# Patient Record
Sex: Male | Born: 1970 | Race: White | Hispanic: No | Marital: Married | Smoking: Never smoker
Health system: Southern US, Community
[De-identification: ages and names within clinical notes are randomized; demographics above are authoritative.]

## PROBLEM LIST (undated history)

## (undated) DIAGNOSIS — G4700 Insomnia, unspecified: Secondary | ICD-10-CM

## (undated) DIAGNOSIS — R61 Generalized hyperhidrosis: Secondary | ICD-10-CM

## (undated) DIAGNOSIS — K602 Anal fissure, unspecified: Secondary | ICD-10-CM

## (undated) DIAGNOSIS — F419 Anxiety disorder, unspecified: Secondary | ICD-10-CM

## (undated) DIAGNOSIS — M545 Low back pain, unspecified: Secondary | ICD-10-CM

## (undated) HISTORY — DX: Insomnia, unspecified: G47.00

## (undated) HISTORY — DX: Low back pain, unspecified: M54.50

## (undated) HISTORY — DX: Anxiety disorder, unspecified: F41.9

## (undated) HISTORY — DX: Generalized hyperhidrosis: R61

## (undated) HISTORY — DX: Low back pain: M54.5

## (undated) HISTORY — DX: Anal fissure, unspecified: K60.2

---

## 2004-08-09 ENCOUNTER — Ambulatory Visit: Payer: Self-pay | Admitting: Family Medicine

## 2004-08-09 ENCOUNTER — Encounter: Admission: RE | Admit: 2004-08-09 | Discharge: 2004-08-09 | Payer: Self-pay | Admitting: Family Medicine

## 2005-09-17 ENCOUNTER — Ambulatory Visit: Payer: Self-pay | Admitting: Family Medicine

## 2005-09-21 ENCOUNTER — Ambulatory Visit: Payer: Self-pay | Admitting: Family Medicine

## 2005-11-27 IMAGING — CR DG CHEST 2V
2 series · 2 of 2 positions shown · non-contrast
Comparison: none

CLINICAL DATA: Cough and pharyngitis.
 TWO VIEW CHEST ? 08/09/04:
 No comparison.  
 Normal sized heart.  Minimal central peribronchial thickening.  No air space consolidation.  Normal appearing bones.

[view not recorded (1 of 2)]
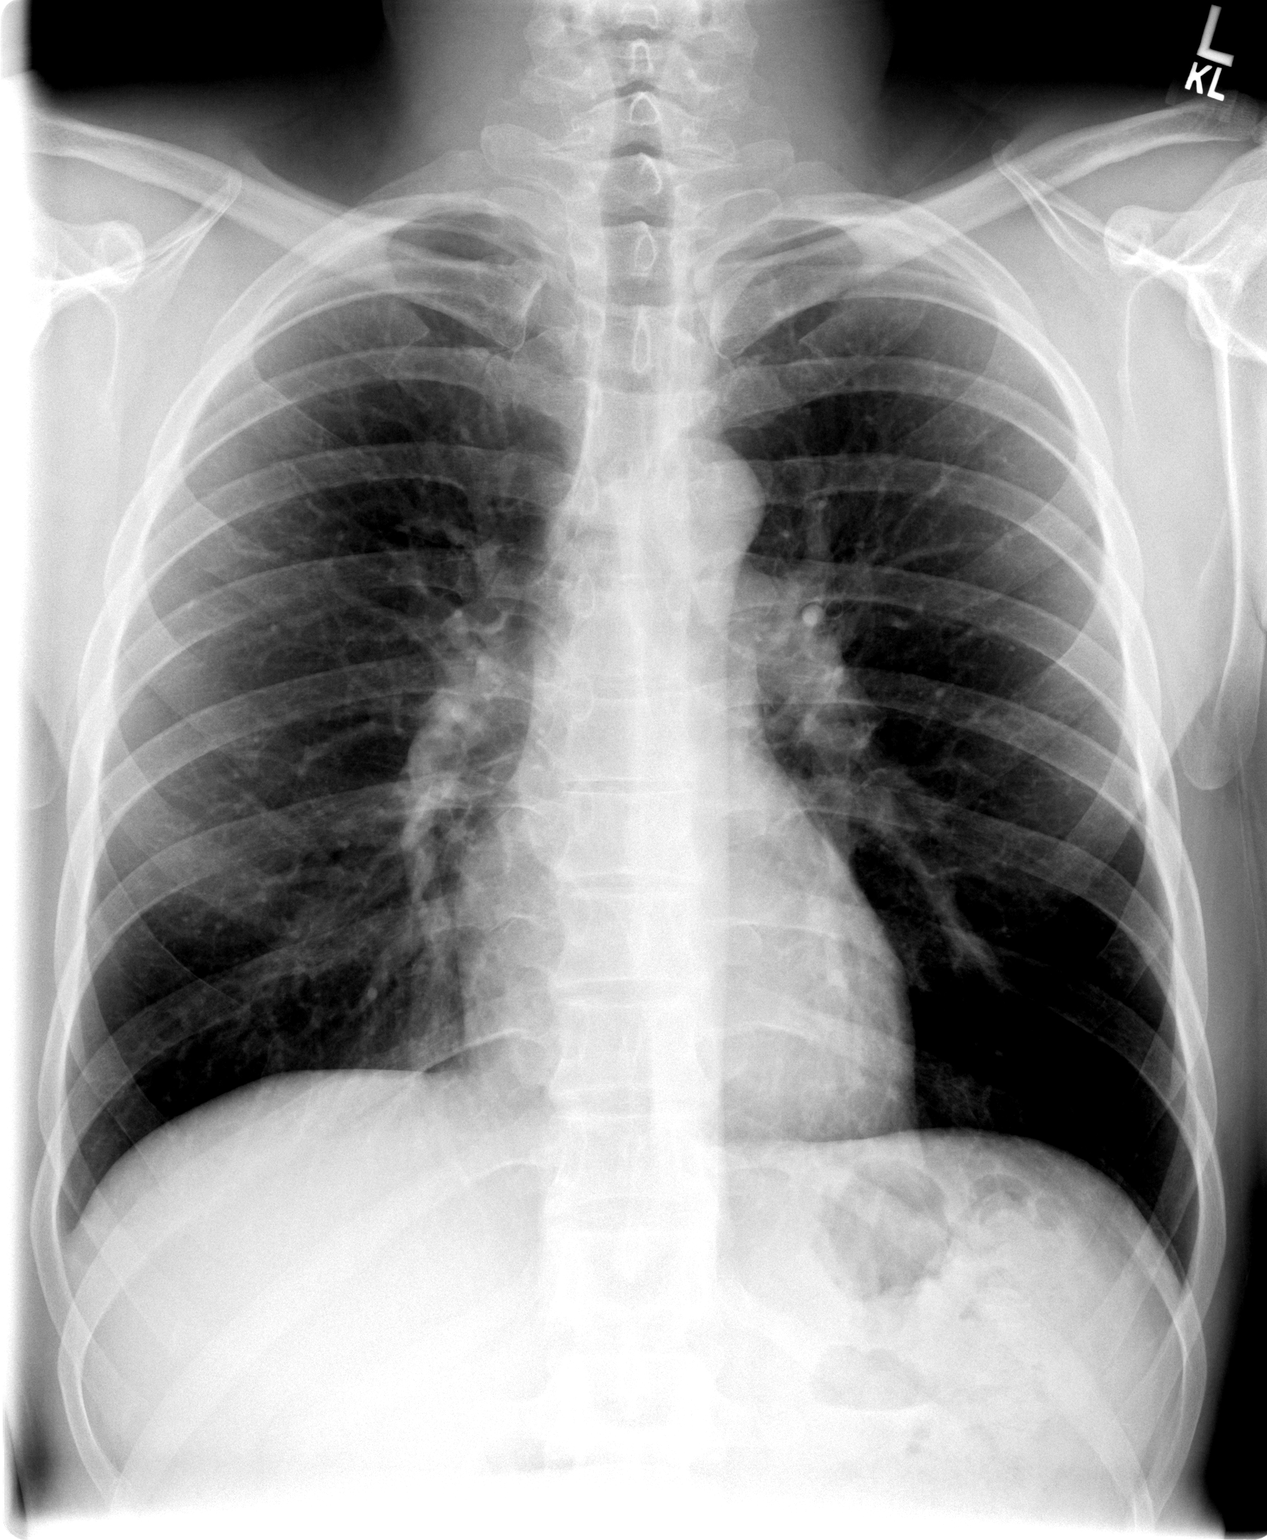

[view not recorded (2 of 2)]
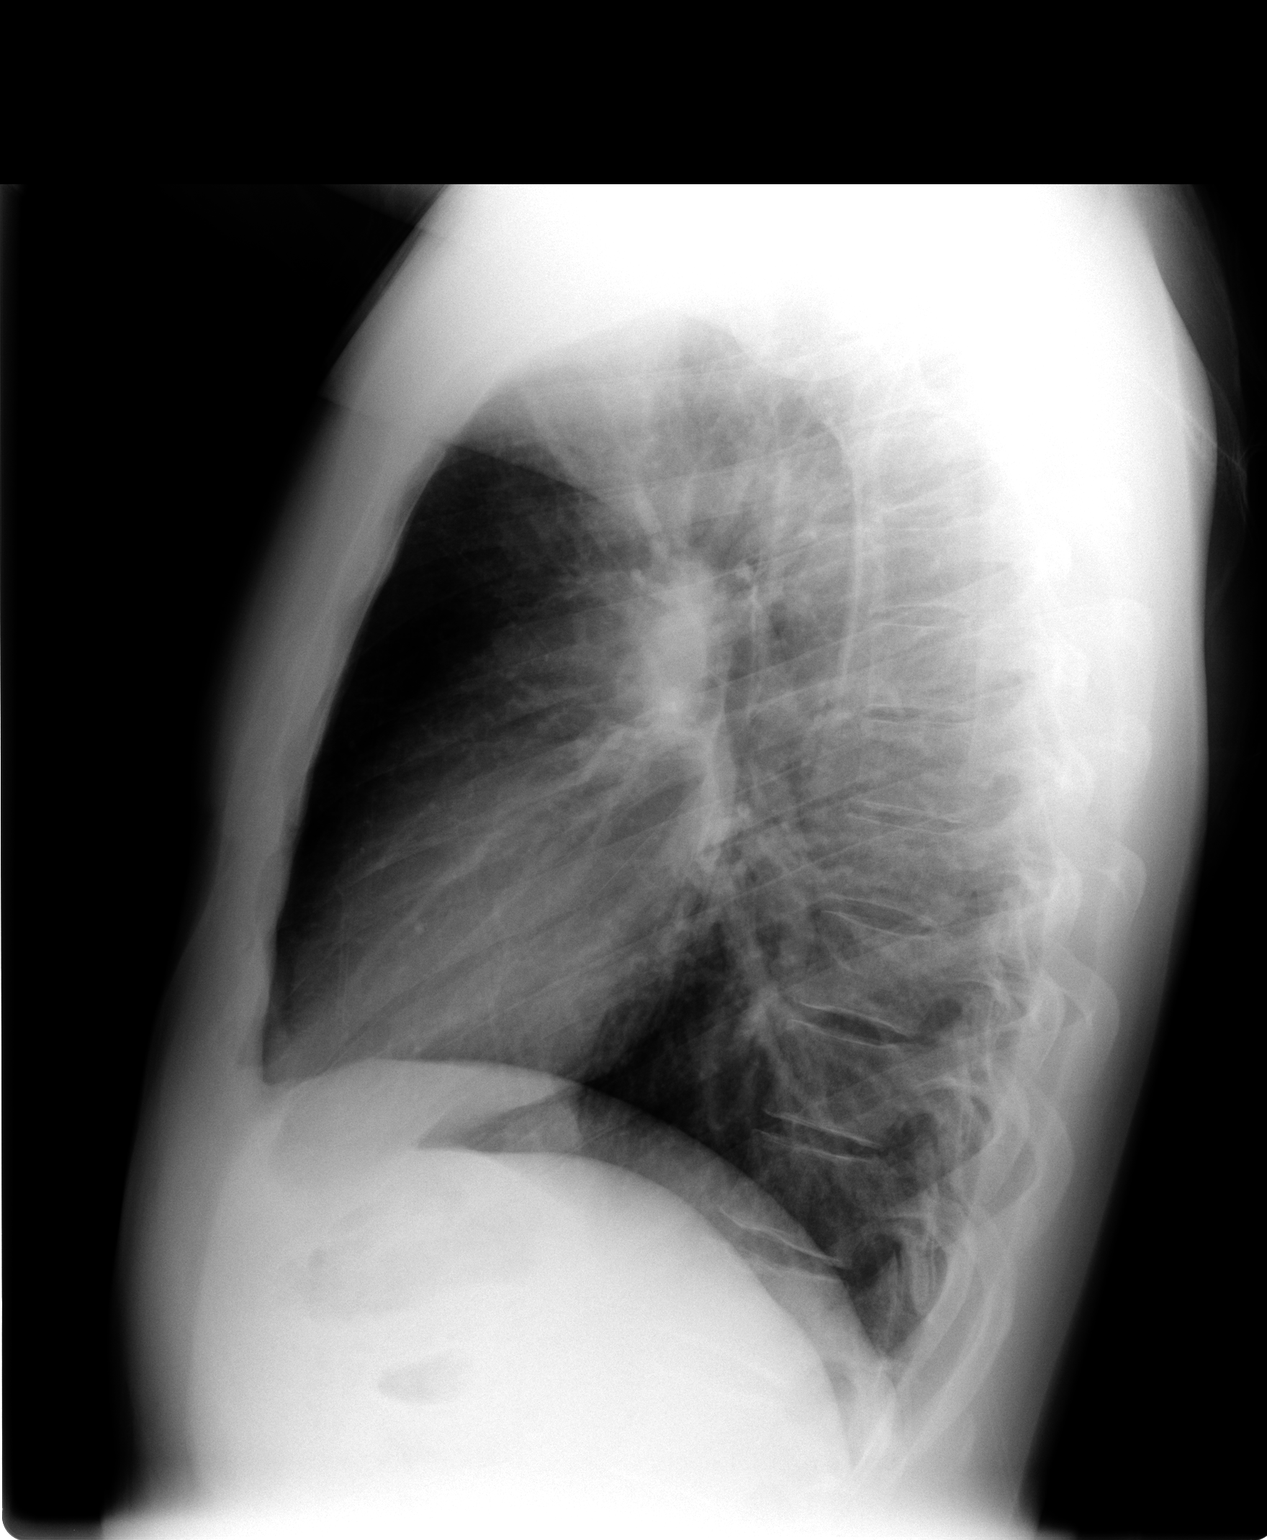

[2 of 2 positions shown; findings below may reference images not displayed]

IMPRESSION: Minimal bronchitic changes.

## 2006-01-18 ENCOUNTER — Ambulatory Visit: Payer: Self-pay | Admitting: Internal Medicine

## 2006-04-01 ENCOUNTER — Ambulatory Visit: Payer: Self-pay | Admitting: Family Medicine

## 2006-09-20 ENCOUNTER — Ambulatory Visit: Payer: Self-pay | Admitting: Internal Medicine

## 2007-03-19 ENCOUNTER — Ambulatory Visit: Payer: Self-pay | Admitting: Family Medicine

## 2007-03-19 DIAGNOSIS — J019 Acute sinusitis, unspecified: Secondary | ICD-10-CM

## 2007-03-19 DIAGNOSIS — Z9189 Other specified personal risk factors, not elsewhere classified: Secondary | ICD-10-CM

## 2007-04-29 ENCOUNTER — Ambulatory Visit: Payer: Self-pay | Admitting: Family Medicine

## 2007-04-29 DIAGNOSIS — R519 Headache, unspecified: Secondary | ICD-10-CM | POA: Insufficient documentation

## 2007-04-29 DIAGNOSIS — R51 Headache: Secondary | ICD-10-CM

## 2007-04-30 LAB — CONVERTED CEMR LAB
ALT: 46 units/L (ref 0–53)
AST: 87 units/L — ABNORMAL HIGH (ref 0–37)
Albumin: 4.1 g/dL (ref 3.5–5.2)
Alkaline Phosphatase: 47 units/L (ref 39–117)
BUN: 17 mg/dL (ref 6–23)
Basophils Absolute: 0 10*3/uL (ref 0.0–0.1)
Basophils Relative: 0.2 % (ref 0.0–1.0)
Bilirubin, Direct: 0.1 mg/dL (ref 0.0–0.3)
CO2: 26 meq/L (ref 19–32)
Calcium: 9.4 mg/dL (ref 8.4–10.5)
Chloride: 105 meq/L (ref 96–112)
Creatinine, Ser: 1 mg/dL (ref 0.4–1.5)
Eosinophils Absolute: 0.3 10*3/uL (ref 0.0–0.6)
Eosinophils Relative: 4.1 % (ref 0.0–5.0)
GFR calc Af Amer: 108 mL/min
GFR calc non Af Amer: 89 mL/min
Glucose, Bld: 92 mg/dL (ref 70–99)
HCT: 43.2 % (ref 39.0–52.0)
Hemoglobin: 14.8 g/dL (ref 13.0–17.0)
Lymphocytes Relative: 26.6 % (ref 12.0–46.0)
MCHC: 34.1 g/dL (ref 30.0–36.0)
MCV: 91.4 fL (ref 78.0–100.0)
Monocytes Absolute: 0.8 10*3/uL — ABNORMAL HIGH (ref 0.2–0.7)
Monocytes Relative: 11.3 % — ABNORMAL HIGH (ref 3.0–11.0)
Neutro Abs: 3.9 10*3/uL (ref 1.4–7.7)
Neutrophils Relative %: 57.8 % (ref 43.0–77.0)
Platelets: 215 10*3/uL (ref 150–400)
Potassium: 4.3 meq/L (ref 3.5–5.1)
RBC: 4.73 M/uL (ref 4.22–5.81)
RDW: 12.7 % (ref 11.5–14.6)
Sodium: 139 meq/L (ref 135–145)
TSH: 1.73 microintl units/mL (ref 0.35–5.50)
Total Bilirubin: 0.6 mg/dL (ref 0.3–1.2)
Total Protein: 6.8 g/dL (ref 6.0–8.3)
WBC: 6.8 10*3/uL (ref 4.5–10.5)

## 2007-05-01 ENCOUNTER — Telehealth: Payer: Self-pay | Admitting: Family Medicine

## 2007-05-01 ENCOUNTER — Ambulatory Visit: Payer: Self-pay | Admitting: Cardiology

## 2007-07-17 DIAGNOSIS — K602 Anal fissure, unspecified: Secondary | ICD-10-CM

## 2007-08-18 ENCOUNTER — Ambulatory Visit: Payer: Self-pay | Admitting: Family Medicine

## 2007-09-25 ENCOUNTER — Ambulatory Visit: Payer: Self-pay | Admitting: Family Medicine

## 2007-09-25 DIAGNOSIS — M546 Pain in thoracic spine: Secondary | ICD-10-CM

## 2007-10-03 ENCOUNTER — Encounter: Admission: RE | Admit: 2007-10-03 | Discharge: 2007-10-03 | Payer: Self-pay | Admitting: Family Medicine

## 2007-10-13 ENCOUNTER — Ambulatory Visit: Payer: Self-pay | Admitting: Family Medicine

## 2007-10-13 ENCOUNTER — Encounter: Payer: Self-pay | Admitting: Family Medicine

## 2007-10-13 ENCOUNTER — Encounter: Admission: RE | Admit: 2007-10-13 | Discharge: 2007-11-12 | Payer: Self-pay | Admitting: Family Medicine

## 2007-11-03 ENCOUNTER — Ambulatory Visit: Payer: Self-pay | Admitting: Licensed Clinical Social Worker

## 2007-11-03 ENCOUNTER — Ambulatory Visit: Payer: Self-pay | Admitting: Family Medicine

## 2007-11-03 DIAGNOSIS — L290 Pruritus ani: Secondary | ICD-10-CM

## 2007-11-03 DIAGNOSIS — R61 Generalized hyperhidrosis: Secondary | ICD-10-CM

## 2007-11-10 ENCOUNTER — Ambulatory Visit: Payer: Self-pay | Admitting: Licensed Clinical Social Worker

## 2007-11-18 ENCOUNTER — Ambulatory Visit: Payer: Self-pay | Admitting: Family Medicine

## 2007-11-18 DIAGNOSIS — R599 Enlarged lymph nodes, unspecified: Secondary | ICD-10-CM

## 2007-11-24 ENCOUNTER — Ambulatory Visit: Payer: Self-pay | Admitting: Licensed Clinical Social Worker

## 2007-12-04 ENCOUNTER — Ambulatory Visit: Payer: Self-pay | Admitting: Family Medicine

## 2007-12-23 ENCOUNTER — Telehealth: Payer: Self-pay | Admitting: Family Medicine

## 2008-02-09 ENCOUNTER — Ambulatory Visit: Payer: Self-pay | Admitting: Family Medicine

## 2008-02-09 LAB — CONVERTED CEMR LAB
Bilirubin Urine: NEGATIVE
Blood in Urine, dipstick: NEGATIVE
Glucose, Urine, Semiquant: NEGATIVE
Ketones, urine, test strip: NEGATIVE
Nitrite: NEGATIVE
Protein, U semiquant: NEGATIVE
Specific Gravity, Urine: 1.01
Urobilinogen, UA: 0.2
WBC Urine, dipstick: NEGATIVE
pH: 6

## 2008-02-10 LAB — CONVERTED CEMR LAB
ALT: 30 units/L (ref 0–53)
AST: 35 units/L (ref 0–37)
Albumin: 4.1 g/dL (ref 3.5–5.2)
Alkaline Phosphatase: 41 units/L (ref 39–117)
BUN: 16 mg/dL (ref 6–23)
Basophils Absolute: 0.1 10*3/uL (ref 0.0–0.1)
Basophils Relative: 1.1 % — ABNORMAL HIGH (ref 0.0–1.0)
Bilirubin, Direct: 0.1 mg/dL (ref 0.0–0.3)
CO2: 30 meq/L (ref 19–32)
Calcium: 9.3 mg/dL (ref 8.4–10.5)
Chloride: 102 meq/L (ref 96–112)
Cholesterol: 198 mg/dL (ref 0–200)
Creatinine, Ser: 0.9 mg/dL (ref 0.4–1.5)
Eosinophils Absolute: 0.4 10*3/uL (ref 0.0–0.7)
Eosinophils Relative: 7.6 % — ABNORMAL HIGH (ref 0.0–5.0)
GFR calc Af Amer: 121 mL/min
GFR calc non Af Amer: 100 mL/min
Glucose, Bld: 93 mg/dL (ref 70–99)
HCT: 44.4 % (ref 39.0–52.0)
HDL: 89.1 mg/dL (ref >39.0)
Hemoglobin: 15.4 g/dL (ref 13.0–17.0)
LDL Cholesterol: 92 mg/dL (ref 0–99)
Lymphocytes Relative: 33.6 % (ref 12.0–46.0)
MCHC: 34.6 g/dL (ref 30.0–36.0)
MCV: 89.7 fL (ref 78.0–100.0)
Monocytes Absolute: 0.4 10*3/uL (ref 0.1–1.0)
Monocytes Relative: 8.4 % (ref 3.0–12.0)
Neutro Abs: 2.5 10*3/uL (ref 1.4–7.7)
Neutrophils Relative %: 49.3 % (ref 43.0–77.0)
Platelets: 182 10*3/uL (ref 150–400)
Potassium: 4.3 meq/L (ref 3.5–5.1)
RBC: 4.95 M/uL (ref 4.22–5.81)
RDW: 13.1 % (ref 11.5–14.6)
Sodium: 139 meq/L (ref 135–145)
TSH: 1.53 microintl units/mL (ref 0.35–5.50)
Total Bilirubin: 1.1 mg/dL (ref 0.3–1.2)
Total CHOL/HDL Ratio: 2.2
Total Protein: 7.1 g/dL (ref 6.0–8.3)
Triglycerides: 83 mg/dL (ref 0–149)
VLDL: 17 mg/dL (ref 0–40)
WBC: 5.1 10*3/uL (ref 4.5–10.5)

## 2008-02-19 ENCOUNTER — Ambulatory Visit: Payer: Self-pay | Admitting: Family Medicine

## 2008-02-19 DIAGNOSIS — F411 Generalized anxiety disorder: Secondary | ICD-10-CM | POA: Insufficient documentation

## 2008-05-20 ENCOUNTER — Ambulatory Visit: Payer: Self-pay | Admitting: Family Medicine

## 2008-06-23 ENCOUNTER — Ambulatory Visit: Payer: Self-pay | Admitting: Family Medicine

## 2008-06-23 DIAGNOSIS — M545 Low back pain: Secondary | ICD-10-CM

## 2008-06-23 DIAGNOSIS — F329 Major depressive disorder, single episode, unspecified: Secondary | ICD-10-CM | POA: Insufficient documentation

## 2008-09-20 ENCOUNTER — Ambulatory Visit: Payer: Self-pay | Admitting: Family Medicine

## 2008-11-17 ENCOUNTER — Telehealth: Payer: Self-pay | Admitting: Family Medicine

## 2009-03-08 ENCOUNTER — Ambulatory Visit: Payer: Self-pay | Admitting: Licensed Clinical Social Worker

## 2009-03-25 ENCOUNTER — Ambulatory Visit: Payer: Self-pay | Admitting: Family Medicine

## 2009-04-04 ENCOUNTER — Telehealth: Payer: Self-pay | Admitting: Family Medicine

## 2009-05-09 ENCOUNTER — Ambulatory Visit: Payer: Self-pay | Admitting: Gastroenterology

## 2009-05-09 DIAGNOSIS — K625 Hemorrhage of anus and rectum: Secondary | ICD-10-CM

## 2009-05-10 ENCOUNTER — Ambulatory Visit: Payer: Self-pay | Admitting: Gastroenterology

## 2009-05-10 HISTORY — PX: OTHER SURGICAL HISTORY: SHX169

## 2009-05-10 LAB — HM COLONOSCOPY

## 2009-07-21 ENCOUNTER — Ambulatory Visit: Payer: Self-pay | Admitting: Family Medicine

## 2009-11-09 ENCOUNTER — Telehealth: Payer: Self-pay | Admitting: Family Medicine

## 2009-11-11 ENCOUNTER — Ambulatory Visit: Payer: Self-pay | Admitting: Family Medicine

## 2009-11-11 DIAGNOSIS — R7402 Elevation of levels of lactic acid dehydrogenase (LDH): Secondary | ICD-10-CM | POA: Insufficient documentation

## 2009-11-11 DIAGNOSIS — R740 Nonspecific elevation of levels of transaminase and lactic acid dehydrogenase [LDH]: Secondary | ICD-10-CM

## 2009-11-14 LAB — CONVERTED CEMR LAB
ALT: 56 units/L — ABNORMAL HIGH (ref 0–53)
AST: 91 units/L — ABNORMAL HIGH (ref 0–37)
Albumin: 4.3 g/dL (ref 3.5–5.2)
Alkaline Phosphatase: 37 units/L — ABNORMAL LOW (ref 39–117)
Ferritin: 42.2 ng/mL (ref 22.0–322.0)
GGT: 25 units/L (ref 7–51)
Iron: 63 ug/dL (ref 42–165)
Total Bilirubin: 0.7 mg/dL (ref 0.3–1.2)

## 2009-11-24 ENCOUNTER — Ambulatory Visit: Payer: Self-pay | Admitting: Family Medicine

## 2009-11-24 DIAGNOSIS — J209 Acute bronchitis, unspecified: Secondary | ICD-10-CM

## 2009-11-24 DIAGNOSIS — J0100 Acute maxillary sinusitis, unspecified: Secondary | ICD-10-CM

## 2009-12-16 ENCOUNTER — Ambulatory Visit: Payer: Self-pay | Admitting: Family Medicine

## 2009-12-16 ENCOUNTER — Telehealth: Payer: Self-pay | Admitting: Family Medicine

## 2009-12-16 DIAGNOSIS — S6390XA Sprain of unspecified part of unspecified wrist and hand, initial encounter: Secondary | ICD-10-CM | POA: Insufficient documentation

## 2009-12-19 LAB — CONVERTED CEMR LAB
ALT: 26 units/L (ref 0–53)
AST: 26 units/L (ref 0–37)
Albumin: 4.1 g/dL (ref 3.5–5.2)
Alkaline Phosphatase: 41 units/L (ref 39–117)
Bilirubin, Direct: 0.1 mg/dL (ref 0.0–0.3)
Total Bilirubin: 0.6 mg/dL (ref 0.3–1.2)
Total Protein: 6.9 g/dL (ref 6.0–8.3)

## 2009-12-20 ENCOUNTER — Telehealth: Payer: Self-pay | Admitting: Family Medicine

## 2010-01-04 ENCOUNTER — Encounter: Payer: Self-pay | Admitting: Family Medicine

## 2010-01-04 LAB — CONVERTED CEMR LAB
HCV Ab: NEGATIVE
Hep B Core Total Ab: NEGATIVE
Hep B S Ab: NEGATIVE
Hepatitis B Surface Ag: NEGATIVE

## 2010-04-21 ENCOUNTER — Ambulatory Visit: Payer: Self-pay | Admitting: Family Medicine

## 2010-04-21 DIAGNOSIS — G4761 Periodic limb movement disorder: Secondary | ICD-10-CM

## 2010-05-15 ENCOUNTER — Encounter: Payer: Self-pay | Admitting: Family Medicine

## 2010-06-13 ENCOUNTER — Encounter: Payer: Self-pay | Admitting: Family Medicine

## 2010-06-26 ENCOUNTER — Encounter: Payer: Self-pay | Admitting: Family Medicine

## 2010-06-27 ENCOUNTER — Encounter: Payer: Self-pay | Admitting: Family Medicine

## 2010-07-27 ENCOUNTER — Encounter: Payer: Self-pay | Admitting: Family Medicine

## 2010-09-19 NOTE — Assessment & Plan Note (Signed)
Summary: ?broken finger/over 1 wk ago/cjr   Vital Signs:  Patient profile:   40 year old male Weight:      182 pounds BMI:     26.97 BP sitting:   110 / 78  (left arm) Cuff size:   regular  Vitals Entered By: Raechel Ache, RN (December 16, 2009 10:41 AM) CC: C/o sore and swollen R middle finger- injured playing softball 8 days ago.   History of Present Illness: Here to check his right 3rd finger which was injured 8 days ago. While trying to catch a baseball, the finger was struck end on. he has been wearing a splint since then. The distal joint is still very tender and swollen however. he is right handed. Also he would like to check his liver enzymes again today.   Allergies: No Known Drug Allergies  Past History:  Past Medical History: Reviewed history from 05/09/2009 and no changes required. insomnia Anxiety hyperhydrosis Low back pain Anal Fissure  Past Surgical History: Reviewed history from 03/19/2007 and no changes required. Denies surgical history  Review of Systems  The patient denies anorexia, fever, weight loss, weight gain, vision loss, decreased hearing, hoarseness, chest pain, syncope, dyspnea on exertion, peripheral edema, prolonged cough, headaches, hemoptysis, abdominal pain, melena, hematochezia, severe indigestion/heartburn, hematuria, incontinence, genital sores, muscle weakness, suspicious skin lesions, transient blindness, difficulty walking, depression, unusual weight change, abnormal bleeding, enlarged lymph nodes, angioedema, breast masses, and testicular masses.    Physical Exam  General:  Well-developed,well-nourished,in no acute distress; alert,appropriate and cooperative throughout examination Extremities:  the right 3rd finger is diffusely swollen, especially the DIP joint. This is tender, and he has very reduced ROM. Allignment is good.   Impression & Recommendations:  Problem # 1:  FINGER SPRAIN (ICD-842.10)  Problem # 2:  TRANSAMINASES,  SERUM, ELEVATED (ICD-790.4)  Orders: Venipuncture (57322) TLB-Hepatic/Liver Function Pnl (80076-HEPATIC)  Complete Medication List: 1)  Analpram-hc 1-2.5 % Crea (Hydrocortisone ace-pramoxine) .... Apply two times a day as needed 2)  Drysol 20 % Soln (Aluminum chloride) .... Apply once daily as needed 3)  Lorazepam 1 Mg Tabs (Lorazepam) .Marland Kitchen.. 1 by mouth every 6 hours as needed for anxiety 4)  Anusol-hc 25 Mg Supp (Hydrocortisone acetate) .... Take one suppository pr q.h.s.  Patient Instructions: 1)  Check liver enzymes today. I am concerned about damage to the joint capsule of this finger, so we will get him in to see Orthopedics today.

## 2010-09-19 NOTE — Op Note (Signed)
Summary: Thoracic Epidural Steroid Injection/[facility]  Thoracic Epidural Steroid Injection/[facility]   Imported By: Maryln Gottron 06/19/2010 12:54:39  _____________________________________________________________________  External Attachment:    Type:   Image     Comment:   External Document

## 2010-09-19 NOTE — Progress Notes (Signed)
Summary: Pt req to get labs ordered to test liver function  Phone Note Call from Patient Call back at Work Phone [phone]   Caller: Patient Reason for Call: Refill Medication Summary of Call: Pt called and is req to be labs order for liver function. Pt had testing done via insurance and liver enzymes were extremly elevated.  Initial call taken by: Lucy Antigua,  November 09, 2009 2:27 PM  Follow-up for Phone Call        pt called back and has ov we dr Shreya Lacasse on Friday am, but is concerned becuase his life insurance was denied due to elevated lfts.  wants to know if dr Rayola Everhart h as any idea why? He will discuss at ov on friday but is very concerned -wonders if he has any ideas? Follow-up by: Willy Eddy, LPN,  November 09, 2009 2:42 PM  Additional Follow-up for Phone Call Additional follow up Details #1::        No I would need to see him to talk about this Additional Follow-up by: Nelwyn Salisbury MD,  November 09, 2009 5:14 PM    Additional Follow-up for Phone Call Additional follow up Details #2::    called. Follow-up by: Raechel Ache, RN,  November 09, 2009 5:21 PM

## 2010-09-19 NOTE — Letter (Signed)
Summary: [facility]  [facility]   Imported By: Maryln Gottron 12/22/2009 13:29:34  _____________________________________________________________________  External Attachment:    Type:   Image     Comment:   External Document

## 2010-09-19 NOTE — Assessment & Plan Note (Signed)
Summary: ?sinus inf/cough/cjr   Vital Signs:  Patient profile:   40 year old male Weight:      182 pounds O2 Sat:      98 % Temp:     98 degrees F Pulse rate:   72 / minute BP sitting:   120 / 78  (left arm)  Vitals Entered By: Pura Spice, RN (November 24, 2009 9:12 AM) CC: cough yellowish sputum stuffy runny nose taking OTC Claritin not helping Is Patient Diabetic? No   History of Present Illness: This 40 year old white male is in today complaining of call productive of yellowish-green sputum, runny stuffy nose and mild sore throat is initiated about 9 days ago and he was taking Claritin but his symptoms were increasing in severity He related a history of having a six-year-old daughter who is in remission from acute lymphocytic leukemia which had its onset at age 68, treated [facility] and is doing well  Allergies (verified): No Known Drug Allergies  Past History:  Past Medical History: Last updated: 05/09/2009 insomnia Anxiety hyperhydrosis Low back pain Anal Fissure  Past Surgical History: Last updated: 03/19/2007 Denies surgical history  Social History: Last updated: 05/09/2009 Married Never Smoked Occupation: Airline pilot Alcohol use-yes Daily Caffeine Use 3-4 Illicit Drug Use - no  Risk Factors: Smoking Status: never (04/29/2007)  Review of Systems  The patient denies anorexia, fever, weight loss, weight gain, vision loss, decreased hearing, hoarseness, chest pain, syncope, dyspnea on exertion, peripheral edema, prolonged cough, headaches, hemoptysis, abdominal pain, melena, hematochezia, severe indigestion/heartburn, hematuria, incontinence, genital sores, muscle weakness, suspicious skin lesions, transient blindness, difficulty walking, depression, unusual weight change, abnormal bleeding, enlarged lymph nodes, angioedema, breast masses, and testicular masses.    Physical Exam  General:  Well-developed,well-nourished,in no acute distress;  alert,appropriate and cooperative throughout examination Head:  Normocephalic and atraumatic without obvious abnormalities. No apparent alopecia or balding. Eyes:  No corneal or conjunctival inflammation noted. EOMI. Perrla. Funduscopic exam benign, without hemorrhages, exudates or papilledema. Vision grossly normal. Ears:  External ear exam shows no significant lesions or deformities.  Otoscopic examination reveals clear canals, tympanic membranes are intact bilaterally without bulging, retraction, inflammation or discharge. Hearing is grossly normal bilaterally. Nose:  nasal congestion erythematous mucosa with yellowish drainage Mouth:  Oral mucosa and oropharynx without lesions or exudates.  Teeth in good repair. Neck:  No deformities, masses, or tenderness noted. Chest Wall:  No deformities, masses, tenderness or gynecomastia noted. Lungs:  rhonchi bilaterally no dullness no lesion Heart:  Normal rate and regular rhythm. S1 and S2 normal without gallop, murmur, click, rub or other extra sounds.   Impression & Recommendations:  Problem # 1:  ACUTE BRONCHITIS (ICD-466.0) Assessment New  His updated medication list for this problem includes:    Zithromax Z-pak 250 Mg Tabs (Azithromycin) .Marland Kitchen... 2 now then 1  each day    Hydromet 5-1.5 Mg/65ml Syrp (Hydrocodone-homatropine) .Marland Kitchen... 1-2 n tsp q4h as needed cough  Orders: Prescription Created Electronically [phone])  Problem # 2:  ACUTE MAXILLARY SINUSITIS (ICD-461.0) Assessment: New  His updated medication list for this problem includes:    Zithromax Z-pak 250 Mg Tabs (Azithromycin) .Marland Kitchen... 2 now then 1  each day    Hydromet 5-1.5 Mg/32ml Syrp (Hydrocodone-homatropine) .Marland Kitchen... 1-2 n tsp q4h as needed cough  Complete Medication List: 1)  Multivitamins Caps (Multiple vitamin) .Marland Kitchen.. 1 by mouth once daily 2)  Analpram-hc 1-2.5 % Crea (Hydrocortisone ace-pramoxine) .... Apply two times a day as needed 3)  Drysol  20 % Soln (Aluminum chloride) ....  Apply once daily as needed 4)  Lorazepam 1 Mg Tabs (Lorazepam) .Marland Kitchen.. 1 by mouth every 6 hours as needed for anxiety 5)  Zoloft 50 Mg Tabs (Sertraline hcl) .... 1/2 once daily 6)  Anusol-hc 25 Mg Supp (Hydrocortisone acetate) .... Take one suppository pr q.h.s. 7)  Zithromax Z-pak 250 Mg Tabs (Azithromycin) .... 2 now then 1  each day 8)  Hydromet 5-1.5 Mg/35ml Syrp (Hydrocodone-homatropine) .Marland Kitchen.. 1-2 n tsp q4h as needed cough  Patient Instructions: 1)  Acute URI, early sinusiti 2)  Zpack 2 now then 1 each day untilfinished 3)  Mucinex D maximum strength 1 AM and 1 PM  4)  Omnarus nasal spray 2 sprays each nostril once daily 5)  Stay well hydrated 6)  Use tylenol , ibuprofen if needed for aching Prescriptions: HYDROMET 5-1.5 MG/5ML SYRP (HYDROCODONE-HOMATROPINE) 1-2 n tsp q4h as needed cough  #240 cc x 0   Entered and Authorized by:   Judithann Sheen MD   Signed by:   Judithann Sheen MD on 11/24/2009   Method used:   Print then Give to Patient   RxID:   [id number] ZITHROMAX Z-PAK 250 MG TABS (AZITHROMYCIN) 2 now then 1  each day  #1 pkge x 0   Entered and Authorized by:   Judithann Sheen MD   Signed by:   Judithann Sheen MD on 11/24/2009   Method used:   Electronically to        [facility]. [phone]* (retail)       [phone] [address]       [place], [place]  [address]       Ph: [phone]       Fax: [phone]   RxID:   [id number]

## 2010-09-19 NOTE — Assessment & Plan Note (Signed)
Summary: sleeping issue/njr   Vital Signs:  Patient profile:   40 year old male Weight:      184 pounds BP sitting:   108 / 78  (left arm) Cuff size:   regular  Vitals Entered By: Raechel Ache, RN (April 21, 2010 4:08 PM) CC: Talk about restless legs.   History of Present Illness: here for 2 reasons. First he has dealt with chronic pain in the low back for several years. he had an MRI in 2009 revealing scattered degenerative changes but nothing surgical. He has had PT, has done yoga, and has seen a chiropractor, but nothing helps. We have tried several NSAIDs also, again with little benefit. Second, for several years his wife says he kicks his legs in his sleep, and it keeps her up at noight. he is not aware of this, and he feels like he sleeps soundly every night.   Allergies (verified): No Known Drug Allergies  Past History:  Past Medical History: Reviewed history from 05/09/2009 and no changes required. insomnia Anxiety hyperhydrosis Low back pain Anal Fissure  Past Surgical History: Reviewed history from 03/19/2007 and no changes required. Denies surgical history  Review of Systems  The patient denies anorexia, fever, weight loss, weight gain, vision loss, decreased hearing, hoarseness, chest pain, syncope, dyspnea on exertion, peripheral edema, prolonged cough, headaches, hemoptysis, abdominal pain, melena, hematochezia, severe indigestion/heartburn, hematuria, incontinence, genital sores, muscle weakness, suspicious skin lesions, transient blindness, difficulty walking, depression, unusual weight change, abnormal bleeding, enlarged lymph nodes, angioedema, breast masses, and testicular masses.    Physical Exam  General:  Well-developed,well-nourished,in no acute distress; alert,appropriate and cooperative throughout examination Msk:  No deformity or scoliosis noted of thoracic or lumbar spine.   Neurologic:  No cranial nerve deficits noted. Station and gait are  normal. Plantar reflexes are down-going bilaterally. DTRs are symmetrical throughout. Sensory, motor and coordinative functions appear intact.   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 Referral (Misc. Ref)  Complete Medication List: 1)  Analpram-hc 1-2.5 % Crea (Hydrocortisone ace-pramoxine) .... Apply two times a day as needed 2)  Drysol 20 % Soln (Aluminum chloride) .... Apply once daily as needed 3)  Lorazepam 1 Mg Tabs (Lorazepam) .Marland Kitchen.. 1 by mouth every 6 hours as needed for anxiety 4)  Anusol-hc 25 Mg Supp (Hydrocortisone acetate) .... Take one suppository pr q.h.s. 5)  Requip 1 Mg Tabs (Ropinirole hcl) .... At bedtime  Patient Instructions: 1)  Try Requip at bedtime. Will refer to Dr. Sheran Luz for his back pain.  Prescriptions: REQUIP 1 MG TABS (ROPINIROLE HCL) at bedtime  #30 x 5   Entered and Authorized by:   Nelwyn Salisbury MD   Signed by:   Nelwyn Salisbury MD on 04/21/2010   Method used:   Electronically to        [facility]. [phone]* (retail)       [phone] [address]       [place], [place]  [address]       Ph: [phone]       Fax: [phone]   RxID:   [id number]

## 2010-09-19 NOTE — Miscellaneous (Signed)
Summary: Flu Shot/[facility]  Flu Shot/[facility]   Imported By: Maryln Gottron 07/03/2010 11:13:14  _____________________________________________________________________  External Attachment:    Type:   Image     Comment:   External Document

## 2010-09-19 NOTE — Letter (Signed)
Summary: [facility]  [facility]   Imported By: Maryln Gottron 01/19/2010 10:06:43  _____________________________________________________________________  External Attachment:    Type:   Image     Comment:   External Document

## 2010-09-19 NOTE — Miscellaneous (Signed)
Summary: flu in received at [facility]   Clinical Lists Changes  Observations: Added new observation of FLU VAX: Historical (06/26/2010 10:37)      Immunization History:  Influenza Immunization History:    Influenza:  historical (06/26/2010)  pt received flu inj. at [facility] [address]  [id number] exp Feb 17 2011.gh rn.........Marland Kitchen

## 2010-09-19 NOTE — Progress Notes (Signed)
Summary: lab results  Phone Note Call from Patient Call back at Work Phone [phone]   Caller: Patient Call For: Nelwyn Salisbury MD Summary of Call: pt would like lab results Initial call taken by: Heron Sabins,  Dec 20, 2009 2:24 PM  Follow-up for Phone Call        Phone Call Completed Follow-up by: Raechel Ache, RN,  Dec 20, 2009 2:47 PM

## 2010-09-19 NOTE — Letter (Signed)
Summary: [facility]  [facility]   Imported By: Maryln Gottron 05/18/2010 12:44:35  _____________________________________________________________________  External Attachment:    Type:   Image     Comment:   External Document

## 2010-09-19 NOTE — Assessment & Plan Note (Signed)
Summary: consult re: lab results/cjr   Vital Signs:  Patient profile:   40 year old male Weight:      183 pounds BP sitting:   130 / 88  (left arm) Cuff size:   regular  Vitals Entered By: Raechel Ache, RN (November 11, 2009 10:18 AM) CC: F/u abnormal labs from [employer] exam.   History of Present Illness: Here to discuss elevations of his liver enzymes, which was discovered on recent lab work he had done for his insurance company. This showed an AST of 226 and an ALT of 90. The last time we checked his labs here in 2009 these were normal, and we got 35 and 30 respectively. he feels fine, and he still works out regularly. Over the past year he admits to increasing his alcohol use, and now he averages drinking a case of beer a week. he has also started taking some OTC supplements inluding fish oil capsules.   Allergies: No Known Drug Allergies  Past History:  Past Medical History: Reviewed history from 05/09/2009 and no changes required. insomnia Anxiety hyperhydrosis Low back pain Anal Fissure  Past Surgical History: Reviewed history from 03/19/2007 and no changes required. Denies surgical history  Review of Systems  The patient denies anorexia, fever, weight loss, weight gain, vision loss, decreased hearing, hoarseness, chest pain, syncope, dyspnea on exertion, peripheral edema, prolonged cough, headaches, hemoptysis, abdominal pain, melena, hematochezia, severe indigestion/heartburn, hematuria, incontinence, genital sores, muscle weakness, suspicious skin lesions, transient blindness, difficulty walking, depression, unusual weight change, abnormal bleeding, enlarged lymph nodes, angioedema, breast masses, and testicular masses.    Physical Exam  General:  Well-developed,well-nourished,in no acute distress; alert,appropriate and cooperative throughout examination Neck:  No deformities, masses, or tenderness noted. Lungs:  Normal respiratory effort, chest expands  symmetrically. Lungs are clear to auscultation, no crackles or wheezes. Heart:  Normal rate and regular rhythm. S1 and S2 normal without gallop, murmur, click, rub or other extra sounds. Abdomen:  Bowel sounds positive,abdomen soft and non-tender without masses, organomegaly or hernias noted.   Impression & Recommendations:  Problem # 1:  TRANSAMINASES, SERUM, ELEVATED (ICD-790.4)  Orders: Venipuncture (72536) TLB-Hepatic/Liver Function Pnl (80076-HEPATIC) TLB-GGT (Gamma GT) (82977-GGT) TLB-Ammonia (82140-AMON) TLB-Ferritin (82728-FER) T-Ceruloplasmin (64403-47425) T-Hepatitis B Core Antibody (95638-75643) T-Hepatitis B Surface Antigen (32951-88416) T-Hepatitis B Surface Antibody (60630-16010) T-Hepatitis C Antibody (93235-57322) T-HIV Antibody  (Reflex) (02542-70623) TLB-Iron, (Fe) Total (83540-FE)  Complete Medication List: 1)  Multivitamins Caps (Multiple vitamin) .Marland Kitchen.. 1 by mouth once daily 2)  Analpram-hc 1-2.5 % Crea (Hydrocortisone ace-pramoxine) .... Apply two times a day as needed 3)  Drysol 20 % Soln (Aluminum chloride) .... Apply once daily as needed 4)  Lorazepam 1 Mg Tabs (Lorazepam) .Marland Kitchen.. 1 by mouth every 6 hours as needed for anxiety 5)  Zoloft 50 Mg Tabs (Sertraline hcl) .... 1/2 once daily 6)  Anusol-hc 25 Mg Supp (Hydrocortisone acetate) .... Take one suppository pr q.h.s.  Patient Instructions: 1)  I think the main culprit for this is his alcohol use, and possibly the fish oil capsules to some degree. He agrees to stop all alcohol use and to stop all OTC supplements. We  will get more labs today, including a work up for hepatitis, etc. Depending on these results, we may pursue an [place] next week.

## 2010-09-19 NOTE — Progress Notes (Signed)
Summary: appt [facility]  Phone Note Outgoing Call   Call placed by: Raechel Ache, RN,  December 16, 2009 11:37 AM Summary of Call: appt today 12:30 Dr Amanda Pea; patient aware. Initial call taken by: Raechel Ache, RN,  December 16, 2009 11:38 AM

## 2010-09-21 NOTE — Letter (Signed)
Summary: [facility]  [facility]   Imported By: Maryln Gottron 08/03/2010 14:05:01  _____________________________________________________________________  External Attachment:    Type:   Image     Comment:   External Document

## 2010-10-30 ENCOUNTER — Telehealth: Payer: Self-pay | Admitting: Family Medicine

## 2010-10-30 NOTE — Telephone Encounter (Signed)
Pt called and has a sinus inf. Req med/antibiotic called in to [facility].  Pls call pt and let them know med has been called in.

## 2011-06-12 ENCOUNTER — Other Ambulatory Visit (INDEPENDENT_AMBULATORY_CARE_PROVIDER_SITE_OTHER): Payer: PRIVATE HEALTH INSURANCE

## 2011-06-12 DIAGNOSIS — Z0000 Encounter for general adult medical examination without abnormal findings: Secondary | ICD-10-CM

## 2011-06-12 LAB — POCT URINALYSIS DIPSTICK
Bilirubin, UA: NEGATIVE
Blood, UA: NEGATIVE
Glucose, UA: NEGATIVE
Ketones, UA: NEGATIVE
Leukocytes, UA: NEGATIVE
Nitrite, UA: NEGATIVE
Protein, UA: NEGATIVE
Spec Grav, UA: 1.01
Urobilinogen, UA: 0.2

## 2011-06-12 LAB — BASIC METABOLIC PANEL
BUN: 16 mg/dL (ref 6–23)
CO2: 27 mEq/L (ref 19–32)
Calcium: 9.2 mg/dL (ref 8.4–10.5)
Chloride: 103 mEq/L (ref 96–112)
Creatinine, Ser: 1 mg/dL (ref 0.4–1.5)
GFR: 86.13 mL/min (ref >60.00)
Glucose, Bld: 99 mg/dL (ref 70–99)
Potassium: 4 mEq/L (ref 3.5–5.1)

## 2011-06-12 LAB — CBC WITH DIFFERENTIAL/PLATELET
Basophils Absolute: 0 10*3/uL (ref 0.0–0.1)
Basophils Relative: 0.9 % (ref 0.0–3.0)
Eosinophils Relative: 7.6 % — ABNORMAL HIGH (ref 0.0–5.0)
HCT: 46.3 % (ref 39.0–52.0)
Hemoglobin: 15.8 g/dL (ref 13.0–17.0)
Lymphs Abs: 1.6 10*3/uL (ref 0.7–4.0)
MCV: 91.2 fl (ref 78.0–100.0)
Monocytes Absolute: 0.4 10*3/uL (ref 0.1–1.0)
Monocytes Relative: 7.8 % (ref 3.0–12.0)
Platelets: 188 10*3/uL (ref 150.0–400.0)
RBC: 5.07 Mil/uL (ref 4.22–5.81)
WBC: 4.8 10*3/uL (ref 4.5–10.5)

## 2011-06-12 LAB — HEPATIC FUNCTION PANEL
ALT: 31 U/L (ref 0–53)
Alkaline Phosphatase: 37 U/L — ABNORMAL LOW (ref 39–117)
Bilirubin, Direct: 0 mg/dL (ref 0.0–0.3)
Total Protein: 7.2 g/dL (ref 6.0–8.3)

## 2011-06-12 LAB — LIPID PANEL
Cholesterol: 191 mg/dL (ref 0–200)
LDL Cholesterol: 99 mg/dL (ref 0–99)
Total CHOL/HDL Ratio: 3
Triglycerides: 97 mg/dL (ref 0.0–149.0)
VLDL: 19.4 mg/dL (ref 0.0–40.0)

## 2011-06-12 LAB — TSH: TSH: 1.47 u[IU]/mL (ref 0.35–5.50)

## 2011-06-15 ENCOUNTER — Telehealth: Payer: Self-pay | Admitting: Family Medicine

## 2011-06-15 NOTE — Telephone Encounter (Signed)
Spoke with pt and gave results. 

## 2011-06-15 NOTE — Telephone Encounter (Signed)
Message copied by Baldemar Friday on Fri Jun 15, 2011  5:25 PM ------      Message from: Gershon Crane A      Created: Fri Jun 15, 2011  1:18 PM       normal

## 2011-06-19 ENCOUNTER — Ambulatory Visit (INDEPENDENT_AMBULATORY_CARE_PROVIDER_SITE_OTHER): Payer: PRIVATE HEALTH INSURANCE | Admitting: Family Medicine

## 2011-06-19 ENCOUNTER — Encounter: Payer: Self-pay | Admitting: Family Medicine

## 2011-06-19 VITALS — BP 112/78 | HR 93 | Temp 98.8°F | Ht 68.75 in | Wt 183.0 lb

## 2011-06-19 DIAGNOSIS — Z0000 Encounter for general adult medical examination without abnormal findings: Secondary | ICD-10-CM

## 2011-06-19 DIAGNOSIS — Z23 Encounter for immunization: Secondary | ICD-10-CM

## 2011-06-19 MED ORDER — HYDROCORTISONE ACE-PRAMOXINE 2.5-1 % RE CREA: 1.0000 "application " | TOPICAL_CREAM | Freq: Two times a day (BID) | RECTAL | Status: DC

## 2011-06-19 MED ORDER — METAXALONE 800 MG PO TABS: 800.0000 mg | ORAL_TABLET | Freq: Three times a day (TID) | ORAL | Status: DC | PRN

## 2011-06-19 MED ORDER — ALUMINUM CHLORIDE 20 % EX SOLN: 1.0000 "application " | Freq: Every day | CUTANEOUS | Status: DC

## 2011-06-19 NOTE — Progress Notes (Signed)
  Subjective:    Patient ID: Cody Wyatt, male    DOB: Feb 23, 1971, 40 y.o.   MRN: [id number]  HPI 40 yr old male for a cpx. He feels well in general. He runs occasionally for exercise. He asks about a non-tender lump he discovered on his left side a few days ago.    Review of Systems  Constitutional: Negative.   HENT: Negative.   Eyes: Negative.   Respiratory: Negative.   Cardiovascular: Negative.   Gastrointestinal: Negative.   Genitourinary: Negative.   Musculoskeletal: Negative.   Skin: Negative.   Neurological: Negative.   Hematological: Negative.   Psychiatric/Behavioral: Negative.        Objective:   Physical Exam  Constitutional: He is oriented to person, place, and time. He appears well-developed and well-nourished. No distress.  HENT:  Head: Normocephalic and atraumatic.  Right Ear: External ear normal.  Left Ear: External ear normal.  Nose: Nose normal.  Mouth/Throat: Oropharynx is clear and moist. No oropharyngeal exudate.  Eyes: Conjunctivae and EOM are normal. Pupils are equal, round, and reactive to light. Right eye exhibits no discharge. Left eye exhibits no discharge. No scleral icterus.  Neck: Neck supple. No JVD present. No tracheal deviation present. No thyromegaly present.  Cardiovascular: Normal rate, regular rhythm, normal heart sounds and intact distal pulses.  Exam reveals no gallop and no friction rub.   No murmur heard. Pulmonary/Chest: Effort normal and breath sounds normal. No respiratory distress. He has no wheezes. He has no rales. He exhibits no tenderness.  Abdominal: Soft. Bowel sounds are normal. He exhibits no distension and no mass. There is no tenderness. There is no rebound and no guarding.  Genitourinary: Rectum normal, prostate normal and penis normal. Guaiac negative stool. No penile tenderness.  Musculoskeletal: Normal range of motion. He exhibits no edema and no tenderness.  Lymphadenopathy:    He has no cervical adenopathy.    Neurological: He is alert and oriented to person, place, and time. He has normal reflexes. No cranial nerve deficit. He exhibits normal muscle tone. Coordination normal.  Skin: Skin is warm and dry. No rash noted. He is not diaphoretic. No erythema. No pallor.       Firm mobile round non-tender lump under the skin over the left ribs   Psychiatric: He has a normal mood and affect. His behavior is normal. Judgment and thought content normal.          Assessment & Plan:  Well exam. He has a lipoma on the side, so I reassured him this is benign.

## 2012-02-14 ENCOUNTER — Ambulatory Visit (INDEPENDENT_AMBULATORY_CARE_PROVIDER_SITE_OTHER): Payer: PRIVATE HEALTH INSURANCE | Admitting: Family Medicine

## 2012-02-14 ENCOUNTER — Encounter: Payer: Self-pay | Admitting: Family Medicine

## 2012-02-14 VITALS — BP 114/76 | HR 66 | Temp 97.9°F | Wt 187.0 lb

## 2012-02-14 DIAGNOSIS — K589 Irritable bowel syndrome without diarrhea: Secondary | ICD-10-CM

## 2012-02-14 MED ORDER — LINACLOTIDE 290 MCG PO CAPS: 290.0000 ug | ORAL_CAPSULE | Freq: Every day | ORAL | Status: DC

## 2012-02-14 NOTE — Progress Notes (Signed)
  Subjective:    Patient ID: Cody Wyatt, male    DOB: 1971-06-26, 41 y.o.   MRN: [id number]  HPI Here for several months of intermittent bloating of the abdomen with mild cramping pains in the lower abdomen. This is worst during the first hour or two after eating a meal. No fever or nausea. No heartburn. His BMs are regular in consistency and frequency. He uses very little alcohol or caffeine. He has eliminated most if not all gluten from his diet, but this has not helped either. He has tried some OTC probiotics with no improvement. He has tried using Miralax daily.    Review of Systems  Constitutional: Negative.   Gastrointestinal: Positive for abdominal pain and abdominal distention. Negative for nausea, vomiting, diarrhea, constipation and blood in stool.  Genitourinary: Negative.        Objective:   Physical Exam  Constitutional: He appears well-developed and well-nourished.  Abdominal: Soft. Bowel sounds are normal. He exhibits no distension and no mass. There is no tenderness. There is no rebound and no guarding.          Assessment & Plan:  This is IBS. Given samples to try Linzess for the next 3 weeks. He will research more info about this

## 2012-11-04 ENCOUNTER — Other Ambulatory Visit (INDEPENDENT_AMBULATORY_CARE_PROVIDER_SITE_OTHER): Payer: PRIVATE HEALTH INSURANCE

## 2012-11-04 DIAGNOSIS — Z0000 Encounter for general adult medical examination without abnormal findings: Secondary | ICD-10-CM

## 2012-11-04 LAB — POCT URINALYSIS DIPSTICK
Bilirubin, UA: NEGATIVE
Blood, UA: NEGATIVE
Ketones, UA: NEGATIVE
Leukocytes, UA: NEGATIVE
Protein, UA: NEGATIVE
Spec Grav, UA: 1.01
Urobilinogen, UA: 0.2
pH, UA: 7

## 2012-11-04 LAB — CBC WITH DIFFERENTIAL/PLATELET
Basophils Absolute: 0 10*3/uL (ref 0.0–0.1)
Basophils Relative: 1 % (ref 0.0–3.0)
Eosinophils Absolute: 0.2 10*3/uL (ref 0.0–0.7)
Eosinophils Relative: 4.8 % (ref 0.0–5.0)
HCT: 43.8 % (ref 39.0–52.0)
Hemoglobin: 14.9 g/dL (ref 13.0–17.0)
Lymphocytes Relative: 31.7 % (ref 12.0–46.0)
Lymphs Abs: 1.4 10*3/uL (ref 0.7–4.0)
Monocytes Relative: 9.3 % (ref 3.0–12.0)
Neutro Abs: 2.4 10*3/uL (ref 1.4–7.7)
RBC: 4.9 Mil/uL (ref 4.22–5.81)
WBC: 4.5 10*3/uL (ref 4.5–10.5)

## 2012-11-04 LAB — LIPID PANEL
Cholesterol: 158 mg/dL (ref 0–200)
LDL Cholesterol: 79 mg/dL (ref 0–99)
Total CHOL/HDL Ratio: 2
Triglycerides: 53 mg/dL (ref 0.0–149.0)
VLDL: 10.6 mg/dL (ref 0.0–40.0)

## 2012-11-04 LAB — TSH: TSH: 1.45 u[IU]/mL (ref 0.35–5.50)

## 2012-11-04 LAB — HEPATIC FUNCTION PANEL
ALT: 26 U/L (ref 0–53)
AST: 23 U/L (ref 0–37)
Albumin: 4.2 g/dL (ref 3.5–5.2)
Alkaline Phosphatase: 36 U/L — ABNORMAL LOW (ref 39–117)
Bilirubin, Direct: 0.1 mg/dL (ref 0.0–0.3)
Total Bilirubin: 0.9 mg/dL (ref 0.3–1.2)
Total Protein: 6.8 g/dL (ref 6.0–8.3)

## 2012-11-04 LAB — BASIC METABOLIC PANEL
BUN: 15 mg/dL (ref 6–23)
CO2: 26 mEq/L (ref 19–32)
Calcium: 9 mg/dL (ref 8.4–10.5)
Chloride: 99 mEq/L (ref 96–112)
Creatinine, Ser: 1 mg/dL (ref 0.4–1.5)
GFR: 87.56 mL/min (ref >60.00)
Potassium: 3.9 mEq/L (ref 3.5–5.1)
Sodium: 132 mEq/L — ABNORMAL LOW (ref 135–145)

## 2012-11-04 NOTE — Progress Notes (Signed)
Quick Note:  Pt has appointment on 11/11/12 will go over then. ______ 

## 2012-11-11 ENCOUNTER — Encounter: Payer: Self-pay | Admitting: Family Medicine

## 2012-11-11 ENCOUNTER — Ambulatory Visit (INDEPENDENT_AMBULATORY_CARE_PROVIDER_SITE_OTHER): Payer: PRIVATE HEALTH INSURANCE | Admitting: Family Medicine

## 2012-11-11 VITALS — BP 124/80 | HR 69 | Temp 98.4°F | Ht 68.25 in | Wt 188.0 lb

## 2012-11-11 DIAGNOSIS — Z0000 Encounter for general adult medical examination without abnormal findings: Secondary | ICD-10-CM

## 2012-11-11 MED ORDER — DICLOFENAC SODIUM 75 MG PO TBEC: 75.0000 mg | DELAYED_RELEASE_TABLET | Freq: Two times a day (BID) | ORAL | Status: DC

## 2012-11-11 MED ORDER — CLONAZEPAM 1 MG PO TABS: 1.0000 mg | ORAL_TABLET | Freq: Two times a day (BID) | ORAL | Status: DC | PRN

## 2012-11-11 MED ORDER — HYDROCORTISONE ACE-PRAMOXINE 2.5-1 % RE CREA: 1.0000 "application " | TOPICAL_CREAM | Freq: Three times a day (TID) | RECTAL | Status: DC

## 2012-11-11 MED ORDER — ALUMINUM CHLORIDE 20 % EX SOLN: 1.0000 "application " | Freq: Every day | CUTANEOUS | Status: DC

## 2012-11-11 NOTE — Progress Notes (Signed)
  Subjective:    Patient ID: Cody Wyatt, male    DOB: 1970/12/03, 42 y.o.   MRN: [id number]  HPI 42 yr old male for a cpx. He has a few issues to discuss. He still has chronic low back pain and uses Skelaxin occasionally. He asks to try something stronger than Advil for this. 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 He asks to try something for this. He and his wife have changed their diets to get rid of all processed foods.    Review of Systems  Constitutional: Negative.   HENT: Negative.   Eyes: Negative.   Respiratory: Negative.   Cardiovascular: Negative.   Gastrointestinal: Negative.   Genitourinary: Negative.   Musculoskeletal: Positive for back pain. Negative for myalgias, joint swelling, arthralgias and gait problem.  Skin: Negative.   Neurological: Negative.   Psychiatric/Behavioral: Negative.        Objective:   Physical Exam  Constitutional: He is oriented to person, place, and time. He appears well-developed and well-nourished. No distress.  HENT:  Head: Normocephalic and atraumatic.  Right Ear: External ear normal.  Left Ear: External ear normal.  Nose: Nose normal.  Mouth/Throat: Oropharynx is clear and moist. No oropharyngeal exudate.  Eyes: Conjunctivae and EOM are normal. Pupils are equal, round, and reactive to light. Right eye exhibits no discharge. Left eye exhibits no discharge. No scleral icterus.  Neck: Neck supple. No JVD present. No tracheal deviation present. No thyromegaly present.  Cardiovascular: Normal rate, regular rhythm, normal heart sounds and intact distal pulses.  Exam reveals no gallop and no friction rub.   No murmur heard. Pulmonary/Chest: Effort normal and breath sounds normal. No respiratory distress. He has no wheezes. He has no rales. He exhibits no tenderness.  Abdominal: Soft. Bowel sounds are normal. He exhibits no distension and no mass. There is no  tenderness. There is no rebound and no guarding.  Genitourinary: Rectum normal, prostate normal and penis normal. Guaiac negative stool. No penile tenderness.  Musculoskeletal: Normal range of motion. He exhibits no edema and no tenderness.  Lymphadenopathy:    He has no cervical adenopathy.  Neurological: He is alert and oriented to person, place, and time. He has normal reflexes. No cranial nerve deficit. He exhibits normal muscle tone. Coordination normal.  Skin: Skin is warm and dry. No rash noted. He is not diaphoretic. No erythema. No pallor.  Psychiatric: He has a normal mood and affect. His behavior is normal. Judgment and thought content normal.          Assessment & Plan:  Well exam. Try Diclofenac for back pain and Clonazepam for situational anxiety.

## 2013-01-13 ENCOUNTER — Ambulatory Visit (INDEPENDENT_AMBULATORY_CARE_PROVIDER_SITE_OTHER): Payer: 59 | Admitting: Family Medicine

## 2013-01-13 ENCOUNTER — Encounter: Payer: Self-pay | Admitting: Family Medicine

## 2013-01-13 VITALS — BP 124/80 | HR 62 | Temp 97.9°F | Wt 188.0 lb

## 2013-01-13 DIAGNOSIS — S91309A Unspecified open wound, unspecified foot, initial encounter: Secondary | ICD-10-CM

## 2013-01-13 DIAGNOSIS — S91312A Laceration without foreign body, left foot, initial encounter: Secondary | ICD-10-CM

## 2013-01-13 DIAGNOSIS — Z23 Encounter for immunization: Secondary | ICD-10-CM

## 2013-01-13 MED ORDER — AMOXICILLIN-POT CLAVULANATE 875-125 MG PO TABS: 1.0000 | ORAL_TABLET | Freq: Two times a day (BID) | ORAL | Status: DC

## 2013-01-13 MED ORDER — TEMAZEPAM 30 MG PO CAPS: 30.0000 mg | ORAL_CAPSULE | Freq: Every evening | ORAL | Status: DC | PRN

## 2013-01-13 NOTE — Addendum Note (Signed)
Addended by: Aniceto Boss A on: 01/13/2013 01:31 PM   Modules accepted: Orders

## 2013-01-13 NOTE — Progress Notes (Signed)
  Subjective:    Patient ID: Cody Wyatt, male    DOB: 06/14/1971, 42 y.o.   MRN: [id number]  HPI Here for a laceration to the bottom of the left [place]. He thinks he stepped on a broken oyster shell. The area is tender but not red. No fever.    Review of Systems  Constitutional: Negative.   Skin: Positive for wound.       Objective:   Physical Exam  Constitutional: He appears well-developed and well-nourished.  Skin:  The sole of the left foot has a jagged superficial wound which is tender. No bleeding. No erythema.           Assessment & Plan:  We will cover for infection with Augmentin. Use warm soaks with saline. Given a TDaP.

## 2013-06-25 ENCOUNTER — Other Ambulatory Visit: Payer: Self-pay

## 2013-07-20 ENCOUNTER — Other Ambulatory Visit: Payer: Self-pay | Admitting: Family Medicine

## 2013-07-20 NOTE — Telephone Encounter (Signed)
Call in #30 with 5 rf 

## 2013-11-24 ENCOUNTER — Encounter: Payer: Self-pay | Admitting: *Deleted

## 2013-11-25 ENCOUNTER — Other Ambulatory Visit (INDEPENDENT_AMBULATORY_CARE_PROVIDER_SITE_OTHER): Payer: 59

## 2013-11-25 DIAGNOSIS — Z0000 Encounter for general adult medical examination without abnormal findings: Secondary | ICD-10-CM

## 2013-11-25 LAB — BASIC METABOLIC PANEL
BUN: 11 mg/dL (ref 6–23)
CO2: 28 mEq/L (ref 19–32)
Calcium: 9.7 mg/dL (ref 8.4–10.5)
Chloride: 97 mEq/L (ref 96–112)
Creatinine, Ser: 0.9 mg/dL (ref 0.4–1.5)
GFR: 94.82 mL/min (ref >60.00)
Glucose, Bld: 89 mg/dL (ref 70–99)
Potassium: 3.8 mEq/L (ref 3.5–5.1)
Sodium: 134 mEq/L — ABNORMAL LOW (ref 135–145)

## 2013-11-25 LAB — CBC WITH DIFFERENTIAL/PLATELET
Basophils Absolute: 0 10*3/uL (ref 0.0–0.1)
Basophils Relative: 0.7 % (ref 0.0–3.0)
Eosinophils Absolute: 0.3 10*3/uL (ref 0.0–0.7)
Eosinophils Relative: 6 % — ABNORMAL HIGH (ref 0.0–5.0)
HCT: 46.6 % (ref 39.0–52.0)
Hemoglobin: 16 g/dL (ref 13.0–17.0)
Lymphocytes Relative: 33.4 % (ref 12.0–46.0)
Lymphs Abs: 1.8 10*3/uL (ref 0.7–4.0)
MCHC: 34.2 g/dL (ref 30.0–36.0)
MCV: 90.7 fl (ref 78.0–100.0)
Monocytes Absolute: 0.5 10*3/uL (ref 0.1–1.0)
Monocytes Relative: 9.6 % (ref 3.0–12.0)
Neutro Abs: 2.8 10*3/uL (ref 1.4–7.7)
Neutrophils Relative %: 50.3 % (ref 43.0–77.0)
Platelets: 192 10*3/uL (ref 150.0–400.0)
RBC: 5.14 Mil/uL (ref 4.22–5.81)
RDW: 13.8 % (ref 11.5–14.6)
WBC: 5.5 10*3/uL (ref 4.5–10.5)

## 2013-11-25 LAB — HEPATIC FUNCTION PANEL
ALT: 28 U/L (ref 0–53)
AST: 30 U/L (ref 0–37)
Albumin: 4.5 g/dL (ref 3.5–5.2)
Alkaline Phosphatase: 34 U/L — ABNORMAL LOW (ref 39–117)
BILIRUBIN DIRECT: 0.1 mg/dL (ref 0.0–0.3)
Total Bilirubin: 1 mg/dL (ref 0.3–1.2)
Total Protein: 7.2 g/dL (ref 6.0–8.3)

## 2013-11-25 LAB — POCT URINALYSIS DIPSTICK
Bilirubin, UA: NEGATIVE
Blood, UA: NEGATIVE
Glucose, UA: NEGATIVE
Ketones, UA: NEGATIVE
Leukocytes, UA: NEGATIVE
Nitrite, UA: NEGATIVE
Protein, UA: NEGATIVE
Spec Grav, UA: 1.01
Urobilinogen, UA: 0.2
pH, UA: 7

## 2013-11-25 LAB — TSH: TSH: 1.65 u[IU]/mL (ref 0.35–5.50)

## 2013-11-25 LAB — LIPID PANEL
Cholesterol: 219 mg/dL — ABNORMAL HIGH (ref 0–200)
HDL: 98.3 mg/dL (ref >39.00)
LDL Cholesterol: 110 mg/dL — ABNORMAL HIGH (ref 0–99)
Total CHOL/HDL Ratio: 2
Triglycerides: 54 mg/dL (ref 0.0–149.0)
VLDL: 10.8 mg/dL (ref 0.0–40.0)

## 2013-12-02 ENCOUNTER — Encounter: Payer: Self-pay | Admitting: Family Medicine

## 2013-12-02 ENCOUNTER — Ambulatory Visit (INDEPENDENT_AMBULATORY_CARE_PROVIDER_SITE_OTHER): Payer: 59 | Admitting: Family Medicine

## 2013-12-02 VITALS — BP 120/78 | HR 71 | Temp 98.9°F | Ht 68.25 in | Wt 175.0 lb

## 2013-12-02 DIAGNOSIS — Z0000 Encounter for general adult medical examination without abnormal findings: Secondary | ICD-10-CM

## 2013-12-02 MED ORDER — HYDROCORTISONE ACE-PRAMOXINE 2.5-1 % RE CREA: 1.0000 "application " | TOPICAL_CREAM | Freq: Three times a day (TID) | RECTAL | Status: DC

## 2013-12-02 MED ORDER — CLONAZEPAM 1 MG PO TABS: 1.0000 mg | ORAL_TABLET | Freq: Two times a day (BID) | ORAL | Status: DC | PRN

## 2013-12-02 MED ORDER — ALUMINUM CHLORIDE 20 % EX SOLN: 1.0000 "application " | Freq: Every day | CUTANEOUS | Status: DC

## 2013-12-02 MED ORDER — TEMAZEPAM 15 MG PO CAPS: 30.0000 mg | ORAL_CAPSULE | Freq: Every evening | ORAL | Status: DC | PRN

## 2013-12-02 MED ORDER — METAXALONE 800 MG PO TABS: 800.0000 mg | ORAL_TABLET | Freq: Three times a day (TID) | ORAL | Status: DC | PRN

## 2013-12-02 NOTE — Progress Notes (Signed)
Pre visit review using our clinic review tool, if applicable. No additional management support is needed unless otherwise documented below in the visit note. 

## 2013-12-02 NOTE — Progress Notes (Signed)
   Subjective:    Patient ID: Cody Wyatt, male    DOB: 1970-10-12, 43 y.o.   MRN: [id number]  HPI 43 yr old male for a cpx. He feels well.    Review of Systems  Constitutional: Negative.   HENT: Negative.   Eyes: Negative.   Respiratory: Negative.   Cardiovascular: Negative.   Gastrointestinal: Negative.   Genitourinary: Negative.   Musculoskeletal: Negative.   Skin: Negative.   Neurological: Negative.   Psychiatric/Behavioral: Negative.        Objective:   Physical Exam  Constitutional: He is oriented to person, place, and time. He appears well-developed and well-nourished. No distress.  HENT:  Head: Normocephalic and atraumatic.  Right Ear: External ear normal.  Left Ear: External ear normal.  Nose: Nose normal.  Mouth/Throat: Oropharynx is clear and moist. No oropharyngeal exudate.  Eyes: Conjunctivae and EOM are normal. Pupils are equal, round, and reactive to light. Right eye exhibits no discharge. Left eye exhibits no discharge. No scleral icterus.  Neck: Neck supple. No JVD present. No tracheal deviation present. No thyromegaly present.  Cardiovascular: Normal rate, regular rhythm, normal heart sounds and intact distal pulses.  Exam reveals no gallop and no friction rub.   No murmur heard. Pulmonary/Chest: Effort normal and breath sounds normal. No respiratory distress. He has no wheezes. He has no rales. He exhibits no tenderness.  Abdominal: Soft. Bowel sounds are normal. He exhibits no distension and no mass. There is no tenderness. There is no rebound and no guarding.  Genitourinary: Rectum normal, prostate normal and penis normal. Guaiac negative stool. No penile tenderness.  Musculoskeletal: Normal range of motion. He exhibits no edema and no tenderness.  Lymphadenopathy:    He has no cervical adenopathy.  Neurological: He is alert and oriented to person, place, and time. He has normal reflexes. No cranial nerve deficit. He exhibits normal muscle tone.  Coordination normal.  Skin: Skin is warm and dry. No rash noted. He is not diaphoretic. No erythema. No pallor.  Psychiatric: He has a normal mood and affect. His behavior is normal. Judgment and thought content normal.          Assessment & Plan:  Well exam.

## 2013-12-22 ENCOUNTER — Encounter: Payer: Self-pay | Admitting: Family Medicine

## 2014-04-20 ENCOUNTER — Encounter: Payer: Self-pay | Admitting: Family Medicine

## 2014-04-20 ENCOUNTER — Ambulatory Visit (INDEPENDENT_AMBULATORY_CARE_PROVIDER_SITE_OTHER): Payer: 59 | Admitting: Family Medicine

## 2014-04-20 VITALS — BP 116/75 | HR 67 | Temp 98.0°F | Ht 68.25 in | Wt 178.0 lb

## 2014-04-20 DIAGNOSIS — S29019A Strain of muscle and tendon of unspecified wall of thorax, initial encounter: Secondary | ICD-10-CM

## 2014-04-20 DIAGNOSIS — S239XXA Sprain of unspecified parts of thorax, initial encounter: Secondary | ICD-10-CM

## 2014-04-20 MED ORDER — DICLOFENAC SODIUM 75 MG PO TBEC: 75.0000 mg | DELAYED_RELEASE_TABLET | Freq: Two times a day (BID) | ORAL | Status: DC

## 2014-04-20 NOTE — Progress Notes (Signed)
Pre visit review using our clinic review tool, if applicable. No additional management support is needed unless otherwise documented below in the visit note. 

## 2014-04-20 NOTE — Progress Notes (Signed)
   Subjective:    Patient ID: Cody Wyatt, male    DOB: [birth year], 43 y.o.   MRN: [id number]  HPI Here for 2 days of a sharp pain in the back just below the right shoulder blade. No SOB or cough. No recent trauma but he does lift weights a few days a week. Advil helps a little.    Review of Systems  Constitutional: Negative.   Respiratory: Negative.   Cardiovascular: Negative.   Musculoskeletal: Positive for back pain.       Objective:   Physical Exam  Constitutional: He appears well-developed and well-nourished. No distress.  Cardiovascular: Normal rate, regular rhythm, normal heart sounds and intact distal pulses.   Pulmonary/Chest: Effort normal and breath sounds normal.  Musculoskeletal:  Tender in the right upper back below the scapula, full ROM of the spine. No spinal tenderness           Assessment & Plan:  Rest, ice, Metaxalone, and Diclofenac. Recheck prn

## 2014-06-04 ENCOUNTER — Other Ambulatory Visit: Payer: Self-pay

## 2014-07-19 ENCOUNTER — Telehealth: Payer: Self-pay | Admitting: Family Medicine

## 2014-07-19 MED ORDER — TEMAZEPAM 15 MG PO CAPS: 30.0000 mg | ORAL_CAPSULE | Freq: Every evening | ORAL | Status: DC | PRN

## 2014-07-19 NOTE — Telephone Encounter (Signed)
done

## 2014-08-31 ENCOUNTER — Ambulatory Visit (INDEPENDENT_AMBULATORY_CARE_PROVIDER_SITE_OTHER): Payer: 59 | Admitting: Family Medicine

## 2014-08-31 DIAGNOSIS — Z23 Encounter for immunization: Secondary | ICD-10-CM

## 2014-09-27 ENCOUNTER — Encounter: Payer: Self-pay | Admitting: Family Medicine

## 2014-09-27 ENCOUNTER — Ambulatory Visit (INDEPENDENT_AMBULATORY_CARE_PROVIDER_SITE_OTHER): Payer: 59 | Admitting: Family Medicine

## 2014-09-27 VITALS — BP 108/73 | HR 67 | Temp 98.0°F | Ht 68.25 in | Wt 181.0 lb

## 2014-09-27 DIAGNOSIS — J0100 Acute maxillary sinusitis, unspecified: Secondary | ICD-10-CM

## 2014-09-27 MED ORDER — AZITHROMYCIN 250 MG PO TABS: ORAL_TABLET | ORAL | Status: DC

## 2014-09-27 NOTE — Progress Notes (Signed)
Pre visit review using our clinic review tool, if applicable. No additional management support is needed unless otherwise documented below in the visit note. 

## 2014-09-27 NOTE — Progress Notes (Signed)
   Subjective:    Patient ID: Cody Wyatt, male    DOB: [birth year], 44 y.o.   MRN: [id number]  HPI Here for 2 weeks of sinus pressure, PND, ST , and coughing up yellow sputum. On Mucinex D.    Review of Systems  Constitutional: Negative.   HENT: Positive for congestion, postnasal drip and sinus pressure.   Eyes: Negative.   Respiratory: Positive for cough.        Objective:   Physical Exam  Constitutional: He appears well-developed and well-nourished.  HENT:  Right Ear: External ear normal.  Left Ear: External ear normal.  Nose: Nose normal.  Mouth/Throat: Oropharynx is clear and moist.  Eyes: Conjunctivae are normal.  Pulmonary/Chest: Effort normal and breath sounds normal.  Lymphadenopathy:    He has no cervical adenopathy.          Assessment & Plan:  Recheck prn

## 2014-10-22 ENCOUNTER — Encounter: Payer: Self-pay | Admitting: Family Medicine

## 2014-10-22 ENCOUNTER — Ambulatory Visit (INDEPENDENT_AMBULATORY_CARE_PROVIDER_SITE_OTHER): Payer: 59 | Admitting: Family Medicine

## 2014-10-22 VITALS — BP 118/80 | HR 67 | Temp 97.8°F | Wt 181.0 lb

## 2014-10-22 DIAGNOSIS — M546 Pain in thoracic spine: Secondary | ICD-10-CM

## 2014-10-22 MED ORDER — HYDROCODONE-ACETAMINOPHEN 5-325 MG PO TABS: 1.0000 | ORAL_TABLET | Freq: Four times a day (QID) | ORAL | Status: DC | PRN

## 2014-10-22 NOTE — Progress Notes (Signed)
   Subjective:    Patient ID: Cody Wyatt, male    DOB: [birth year], 44 y.o.   MRN: [id number]  HPI  Acute visit. Patient seen with left thoracic back pain. This morning he was reaching for a toothbrush and felt sudden pain and sensation of spasm. He had some leftover Skelaxin 800 mg which has helped somewhat. He still has some pain. He has not had any cough or fever. No thoracic spine pain. His pain is lateral to this on the left side. His pain is worse with movement. He applied a heat patch which helped somewhat. He's had somewhat similar episode once before but never to this severity. Denies any dyspnea.  Past Medical History  Diagnosis Date  . Insomnia   . Anxiety   . Hyperhydrosis disorder   . Low back pain   . Anal fissure    Past Surgical History  Procedure Laterality Date  . Colonoscopy  05-10-09    per Dr. Deatra Ina, normal     reports that he has never smoked. He has never used smokeless tobacco. He reports that he drinks alcohol. He reports that he does not use illicit drugs. family history includes Colon polyps in his father; Hypertension in his other; Leukemia in his daughter. No Known Allergies   Review of Systems  Constitutional: Negative for fever and chills.  Respiratory: Negative for cough and shortness of breath.   Cardiovascular: Negative for chest pain.  Skin: Negative for rash.       Objective:   Physical Exam  Constitutional: He appears well-developed and well-nourished.  Cardiovascular: Normal rate and regular rhythm.  Exam reveals no gallop and no friction rub.   No murmur heard. Pulmonary/Chest: Effort normal and breath sounds normal. No respiratory distress. He has no wheezes. He has no rales.  Musculoskeletal:  No thoracic spinal tenderness. He has some left parathoracic muscular tenderness  Skin: No rash noted.          Assessment & Plan:  Muscular or myofascial strain left thoracic region. We recommended moist heat and muscle massage.  Advil or Aleve over-the-counter and continue Skelaxin as needed. Wrote for very limited hydrocodone 5 mg #20 with no refill 1-2 every 6 hours for severe pain. Follow-up with primary in one week if no better

## 2014-10-22 NOTE — Patient Instructions (Signed)
Try topical heat and muscle massage Also suggest over-the-counter Aleve or ibuprofen for the next few days as needed. May continue Skelaxin 800 mg every 8 hours as needed. Take hydrocodone only for severe pain not relieved with the above

## 2014-10-22 NOTE — Progress Notes (Signed)
Pre visit review using our clinic review tool, if applicable. No additional management support is needed unless otherwise documented below in the visit note. 

## 2015-02-18 ENCOUNTER — Other Ambulatory Visit: Payer: Self-pay | Admitting: Family Medicine

## 2015-02-18 NOTE — Telephone Encounter (Signed)
Refill each for 6 months 

## 2015-06-01 ENCOUNTER — Other Ambulatory Visit (INDEPENDENT_AMBULATORY_CARE_PROVIDER_SITE_OTHER): Payer: 59

## 2015-06-01 DIAGNOSIS — Z0000 Encounter for general adult medical examination without abnormal findings: Secondary | ICD-10-CM

## 2015-06-01 LAB — LIPID PANEL
CHOL/HDL RATIO: 3
CHOLESTEROL: 221 mg/dL — AB (ref 0–200)
HDL: 84.7 mg/dL (ref >39.00)
LDL CALC: 120 mg/dL — AB (ref 0–99)
NONHDL: 136.13
Triglycerides: 83 mg/dL (ref 0.0–149.0)
VLDL: 16.6 mg/dL (ref 0.0–40.0)

## 2015-06-01 LAB — CBC WITH DIFFERENTIAL/PLATELET
BASOS ABS: 0 10*3/uL (ref 0.0–0.1)
Basophils Relative: 0.9 % (ref 0.0–3.0)
EOS ABS: 0.2 10*3/uL (ref 0.0–0.7)
Eosinophils Relative: 3.8 % (ref 0.0–5.0)
HEMATOCRIT: 48.7 % (ref 39.0–52.0)
HEMOGLOBIN: 16.1 g/dL (ref 13.0–17.0)
LYMPHS PCT: 28.8 % (ref 12.0–46.0)
Lymphs Abs: 1.4 10*3/uL (ref 0.7–4.0)
MCHC: 33.1 g/dL (ref 30.0–36.0)
MCV: 91.5 fl (ref 78.0–100.0)
MONOS PCT: 7.5 % (ref 3.0–12.0)
Monocytes Absolute: 0.4 10*3/uL (ref 0.1–1.0)
NEUTROS ABS: 2.8 10*3/uL (ref 1.4–7.7)
Neutrophils Relative %: 59 % (ref 43.0–77.0)
PLATELETS: 210 10*3/uL (ref 150.0–400.0)
RBC: 5.32 Mil/uL (ref 4.22–5.81)
RDW: 13.8 % (ref 11.5–15.5)
WBC: 4.8 10*3/uL (ref 4.0–10.5)

## 2015-06-01 LAB — HEPATIC FUNCTION PANEL
ALK PHOS: 37 U/L — AB (ref 39–117)
ALT: 28 U/L (ref 0–53)
AST: 23 U/L (ref 0–37)
Albumin: 4.5 g/dL (ref 3.5–5.2)
BILIRUBIN DIRECT: 0.2 mg/dL (ref 0.0–0.3)
TOTAL PROTEIN: 7.2 g/dL (ref 6.0–8.3)
Total Bilirubin: 0.8 mg/dL (ref 0.2–1.2)

## 2015-06-01 LAB — BASIC METABOLIC PANEL
BUN: 13 mg/dL (ref 6–23)
CALCIUM: 9.9 mg/dL (ref 8.4–10.5)
CO2: 31 mEq/L (ref 19–32)
CREATININE: 0.98 mg/dL (ref 0.40–1.50)
Chloride: 100 mEq/L (ref 96–112)
GFR: 87.56 mL/min (ref >60.00)
Glucose, Bld: 83 mg/dL (ref 70–99)
Potassium: 4.6 mEq/L (ref 3.5–5.1)
SODIUM: 137 meq/L (ref 135–145)

## 2015-06-01 LAB — POCT URINALYSIS DIPSTICK
BILIRUBIN UA: NEGATIVE
Blood, UA: NEGATIVE
GLUCOSE UA: NEGATIVE
Ketones, UA: NEGATIVE
Leukocytes, UA: NEGATIVE
Nitrite, UA: NEGATIVE
Protein, UA: NEGATIVE
SPEC GRAV UA: 1.015
UROBILINOGEN UA: 0.2
pH, UA: 7

## 2015-06-01 LAB — TSH: TSH: 1.55 u[IU]/mL (ref 0.35–4.50)

## 2015-06-07 ENCOUNTER — Ambulatory Visit (INDEPENDENT_AMBULATORY_CARE_PROVIDER_SITE_OTHER): Payer: 59 | Admitting: Family Medicine

## 2015-06-07 ENCOUNTER — Encounter: Payer: Self-pay | Admitting: Family Medicine

## 2015-06-07 VITALS — BP 103/70 | HR 64 | Temp 98.5°F | Ht 68.25 in | Wt 166.0 lb

## 2015-06-07 DIAGNOSIS — Z23 Encounter for immunization: Secondary | ICD-10-CM

## 2015-06-07 DIAGNOSIS — Z0000 Encounter for general adult medical examination without abnormal findings: Secondary | ICD-10-CM | POA: Diagnosis not present

## 2015-06-07 MED ORDER — PROPRANOLOL HCL 10 MG PO TABS: ORAL_TABLET | ORAL | Status: DC

## 2015-06-07 NOTE — Progress Notes (Signed)
Pre visit review using our clinic review tool, if applicable. No additional management support is needed unless otherwise documented below in the visit note. 

## 2015-06-07 NOTE — Progress Notes (Signed)
   Subjective:    Patient ID: Cody Wyatt, male    DOB: 1971-01-20, 44 y.o.   MRN: [id number]  HPI 44 yr old male for a cpx. He feels great and has no complaints. When we go over is medications, I see he uses Clonazepam for work presentations since he tends to feels nervous. This works well but often leaves him sedated. He still exercises and tries to eat well.    Review of Systems  Constitutional: Negative.   HENT: Negative.   Eyes: Negative.   Respiratory: Negative.   Cardiovascular: Negative.   Gastrointestinal: Negative.   Genitourinary: Negative.   Musculoskeletal: Negative.   Skin: Negative.   Neurological: Negative.   Psychiatric/Behavioral: Negative.        Objective:   Physical Exam  Constitutional: He is oriented to person, place, and time. He appears well-developed and well-nourished. No distress.  HENT:  Head: Normocephalic and atraumatic.  Right Ear: External ear normal.  Left Ear: External ear normal.  Nose: Nose normal.  Mouth/Throat: Oropharynx is clear and moist. No oropharyngeal exudate.  Eyes: Conjunctivae and EOM are normal. Pupils are equal, round, and reactive to light. Right eye exhibits no discharge. Left eye exhibits no discharge. No scleral icterus.  Neck: Neck supple. No JVD present. No tracheal deviation present. No thyromegaly present.  Cardiovascular: Normal rate, regular rhythm, normal heart sounds and intact distal pulses.  Exam reveals no gallop and no friction rub.   No murmur heard. Pulmonary/Chest: Effort normal and breath sounds normal. No respiratory distress. He has no wheezes. He has no rales. He exhibits no tenderness.  Abdominal: Soft. Bowel sounds are normal. He exhibits no distension and no mass. There is no tenderness. There is no rebound and no guarding.  Genitourinary: Rectum normal, prostate normal and penis normal. Guaiac negative stool. No penile tenderness.  Musculoskeletal: Normal range of motion. He exhibits no edema or  tenderness.  Lymphadenopathy:    He has no cervical adenopathy.  Neurological: He is alert and oriented to person, place, and time. He has normal reflexes. No cranial nerve deficit. He exhibits normal muscle tone. Coordination normal.  Skin: Skin is warm and dry. No rash noted. He is not diaphoretic. No erythema. No pallor.  Psychiatric: He has a normal mood and affect. His behavior is normal. Judgment and thought content normal.          Assessment & Plan:  Well exam. We discussed diet and exercise advice. For the work presentations, I suggested he try Propranolol to avoid sedation effects. He will let me know how this works.

## 2015-07-08 ENCOUNTER — Ambulatory Visit (INDEPENDENT_AMBULATORY_CARE_PROVIDER_SITE_OTHER): Payer: 59 | Admitting: Family Medicine

## 2015-07-08 VITALS — BP 120/80 | HR 80 | Temp 98.2°F | Ht 68.25 in | Wt 170.0 lb

## 2015-07-08 DIAGNOSIS — J019 Acute sinusitis, unspecified: Secondary | ICD-10-CM

## 2015-07-08 MED ORDER — AZITHROMYCIN 250 MG PO TABS: ORAL_TABLET | ORAL | Status: DC

## 2015-07-08 NOTE — Progress Notes (Signed)
Pre visit review using our clinic review tool, if applicable. No additional management support is needed unless otherwise documented below in the visit note. 

## 2015-07-11 ENCOUNTER — Encounter: Payer: Self-pay | Admitting: Family Medicine

## 2015-07-11 NOTE — Progress Notes (Signed)
   Subjective:    Patient ID: Cody Wyatt, male    DOB: November 12, 1970, 44 y.o.   MRN: [id number]  HPI Here for 2 weeks of sinus pressure, PND, and a dry cough.    Review of Systems  Constitutional: Negative.   HENT: Positive for congestion, postnasal drip and sinus pressure. Negative for sore throat.   Eyes: Negative.   Respiratory: Positive for cough.        Objective:   Physical Exam  Constitutional: He appears well-developed and well-nourished.  HENT:  Right Ear: External ear normal.  Left Ear: External ear normal.  Nose: Nose normal.  Mouth/Throat: Oropharynx is clear and moist.  Eyes: Conjunctivae are normal.  Cardiovascular: Normal rate, regular rhythm, normal heart sounds and intact distal pulses.   Pulmonary/Chest: Effort normal and breath sounds normal.  Lymphadenopathy:    He has no cervical adenopathy.          Assessment & Plan:  Sinusitis, treat with a Zpack and Mucinex.

## 2015-10-12 ENCOUNTER — Other Ambulatory Visit: Payer: Self-pay | Admitting: Family Medicine

## 2015-10-12 NOTE — Telephone Encounter (Signed)
Call in #60 with 5 rf 

## 2015-11-07 ENCOUNTER — Other Ambulatory Visit: Payer: Self-pay | Admitting: Family Medicine

## 2015-11-08 NOTE — Telephone Encounter (Signed)
Call in #60 with 5 rf 

## 2016-03-26 ENCOUNTER — Ambulatory Visit (INDEPENDENT_AMBULATORY_CARE_PROVIDER_SITE_OTHER): Payer: 59 | Admitting: Family Medicine

## 2016-03-26 ENCOUNTER — Encounter: Payer: Self-pay | Admitting: Family Medicine

## 2016-03-26 VITALS — BP 109/68 | HR 65 | Temp 98.4°F | Ht 68.25 in | Wt 177.0 lb

## 2016-03-26 DIAGNOSIS — R21 Rash and other nonspecific skin eruption: Secondary | ICD-10-CM

## 2016-03-26 MED ORDER — KETOCONAZOLE 2 % EX CREA: 1.0000 "application " | TOPICAL_CREAM | Freq: Two times a day (BID) | CUTANEOUS | 5 refills | Status: DC | PRN

## 2016-03-26 MED ORDER — ALUMINUM CHLORIDE 20 % EX SOLN: 1.0000 "application " | Freq: Every day | CUTANEOUS | 11 refills | Status: DC

## 2016-03-26 NOTE — Progress Notes (Signed)
Pre visit review using our clinic review tool, if applicable. No additional management support is needed unless otherwise documented below in the visit note. 

## 2016-03-26 NOTE — Progress Notes (Signed)
   Subjective:    Patient ID: Cody Wyatt, male    DOB: 1970/09/08, 45 y.o.   MRN: [id number]  HPI Here for 2 types of rash. First he has had an irritation in both groins for several weeks. Second he has had a rash in the left armpit for 5 days after using an expired bottle of Drysol.    Review of Systems  Constitutional: Negative.   Skin: Positive for rash.       Objective:   Physical Exam  Constitutional: He appears well-developed and well-nourished.  Skin:  The left axilla has a red maculopapular rash. Both groins have a red macular rash.           Assessment & Plan:  He has an allergic reaction in the axilla, use OTC cortisone cream prn. I refilled the Drysol for a fresh supply. Treat the Candidal rash in the groins with Ketoconazole cream.  Laurey Morale, MD

## 2016-03-30 ENCOUNTER — Ambulatory Visit: Payer: 59 | Admitting: Adult Health

## 2016-04-25 ENCOUNTER — Other Ambulatory Visit: Payer: Self-pay | Admitting: Family Medicine

## 2016-04-25 NOTE — Telephone Encounter (Signed)
Call in #60 with 5 rf 

## 2016-06-18 ENCOUNTER — Encounter: Payer: Self-pay | Admitting: Family Medicine

## 2016-06-18 ENCOUNTER — Ambulatory Visit (INDEPENDENT_AMBULATORY_CARE_PROVIDER_SITE_OTHER): Payer: 59 | Admitting: Family Medicine

## 2016-06-18 VITALS — BP 117/78 | HR 61 | Temp 98.2°F | Ht 68.25 in | Wt 178.0 lb

## 2016-06-18 DIAGNOSIS — J019 Acute sinusitis, unspecified: Secondary | ICD-10-CM | POA: Diagnosis not present

## 2016-06-18 MED ORDER — AMOXICILLIN-POT CLAVULANATE 875-125 MG PO TABS: 1.0000 | ORAL_TABLET | Freq: Two times a day (BID) | ORAL | 0 refills | Status: DC

## 2016-06-18 NOTE — Progress Notes (Signed)
   Subjective:    Patient ID: Cody Wyatt, male    DOB: 1971-04-24, 45 y.o.   MRN: [id number]  HPI Here for 3 days of sinus pressure, PND, and a ST. No cough. His daughter is being treated now with Amoxicillin for a sinus infection.   Review of Systems  Constitutional: Negative.   HENT: Positive for congestion, postnasal drip, sinus pressure and sore throat.   Eyes: Negative.   Respiratory: Negative.        Objective:   Physical Exam  Constitutional: He appears well-developed and well-nourished.  HENT:  Right Ear: External ear normal.  Left Ear: External ear normal.  Nose: Nose normal.  Mouth/Throat: Oropharynx is clear and moist.  Eyes: Conjunctivae are normal.  Neck: No thyromegaly present.  Pulmonary/Chest: Effort normal and breath sounds normal.  Lymphadenopathy:    He has no cervical adenopathy.          Assessment & Plan:  Sinusitis, treat with Augmentin.  Laurey Morale, MD

## 2016-06-18 NOTE — Progress Notes (Signed)
Pre visit review using our clinic review tool, if applicable. No additional management support is needed unless otherwise documented below in the visit note. 

## 2016-08-23 ENCOUNTER — Ambulatory Visit (INDEPENDENT_AMBULATORY_CARE_PROVIDER_SITE_OTHER): Payer: 59 | Admitting: *Deleted

## 2016-08-23 DIAGNOSIS — Z23 Encounter for immunization: Secondary | ICD-10-CM

## 2016-09-17 ENCOUNTER — Other Ambulatory Visit: Payer: Self-pay | Admitting: Family Medicine

## 2016-11-12 LAB — BASIC METABOLIC PANEL
BUN: 17 mg/dL (ref 4–21)
CREATININE: 0.9 mg/dL (ref 0.6–1.3)
Glucose: 92 mg/dL
POTASSIUM: 4.5 mmol/L (ref 3.4–5.3)
SODIUM: 134 mmol/L — AB (ref 137–147)

## 2016-11-12 LAB — CBC AND DIFFERENTIAL
HEMATOCRIT: 43 % (ref 41–53)
Hemoglobin: 14.7 g/dL (ref 13.5–17.5)
Platelets: 215 10*3/uL (ref 150–399)
WBC: 5.2 10^3/mL

## 2016-11-12 LAB — HEPATIC FUNCTION PANEL
ALT: 23 U/L (ref 10–40)
AST: 20 U/L (ref 14–40)
Alkaline Phosphatase: 34 U/L (ref 25–125)
BILIRUBIN, TOTAL: 0.4 mg/dL

## 2016-11-12 LAB — HEMOGLOBIN A1C: HEMOGLOBIN A1C: 5.3

## 2016-11-12 LAB — LIPID PANEL
Cholesterol: 204 mg/dL — AB (ref 0–200)
HDL: 99 mg/dL — AB (ref 35–70)
LDL Cholesterol: 97 mg/dL
TRIGLYCERIDES: 42 mg/dL (ref 40–160)

## 2016-11-13 ENCOUNTER — Encounter: Payer: Self-pay | Admitting: Family Medicine

## 2016-11-27 ENCOUNTER — Other Ambulatory Visit: Payer: Self-pay | Admitting: Family Medicine

## 2016-11-27 NOTE — Telephone Encounter (Signed)
Rx called in to patient's pharmacy.

## 2016-11-27 NOTE — Telephone Encounter (Signed)
Call in #60 with 5 rf 

## 2016-12-05 ENCOUNTER — Telehealth: Payer: Self-pay | Admitting: Family Medicine

## 2016-12-05 NOTE — Telephone Encounter (Signed)
Pharmacy called for pt to request a refill of clonazePAM (KLONOPIN) 1 MG tablet  [id number] - [place], [place] [address]

## 2016-12-05 NOTE — Telephone Encounter (Signed)
Pt also requesting a refill on Metalone 800 mg take 1 po tid prn.

## 2016-12-05 NOTE — Telephone Encounter (Signed)
Call in Clonazepam #60 with 5 rf, also Metaxolone #90 with 5 rf

## 2016-12-06 MED ORDER — METAXALONE 800 MG PO TABS: 800.0000 mg | ORAL_TABLET | Freq: Three times a day (TID) | ORAL | 5 refills | Status: DC | PRN

## 2016-12-06 MED ORDER — CLONAZEPAM 1 MG PO TABS: ORAL_TABLET | ORAL | 5 refills | Status: DC

## 2016-12-06 NOTE — Telephone Encounter (Signed)
Scripts were sent to pharmacy and we updated this in chart, also left a voice message for pt.

## 2016-12-11 ENCOUNTER — Telehealth: Payer: Self-pay | Admitting: Family Medicine

## 2016-12-11 NOTE — Telephone Encounter (Signed)
Pt requesting a 90 day supply of Metaxalone 800 mg take 1 po tid prn and [facility]

## 2016-12-12 MED ORDER — METAXALONE 800 MG PO TABS: 800.0000 mg | ORAL_TABLET | Freq: Three times a day (TID) | ORAL | 3 refills | Status: DC | PRN

## 2016-12-12 NOTE — Telephone Encounter (Signed)
Call in #270 with 3 rf 

## 2016-12-12 NOTE — Telephone Encounter (Signed)
Filled on 12/06/16 for 30 day Sent to the pharmacy per Dr. Sarajane Jews

## 2016-12-12 NOTE — Telephone Encounter (Signed)
Can we change this to a 90 day supply, just sent in recently?

## 2016-12-19 ENCOUNTER — Encounter: Payer: Self-pay | Admitting: Family Medicine

## 2016-12-19 ENCOUNTER — Ambulatory Visit (INDEPENDENT_AMBULATORY_CARE_PROVIDER_SITE_OTHER): Payer: 59 | Admitting: Family Medicine

## 2016-12-19 VITALS — BP 120/81 | HR 61 | Temp 97.8°F | Ht 68.25 in | Wt 178.0 lb

## 2016-12-19 DIAGNOSIS — Z9109 Other allergy status, other than to drugs and biological substances: Secondary | ICD-10-CM

## 2016-12-19 MED ORDER — HYDROCODONE-HOMATROPINE 5-1.5 MG/5ML PO SYRP: 5.0000 mL | ORAL_SOLUTION | ORAL | 0 refills | Status: DC | PRN

## 2016-12-19 MED ORDER — METHYLPREDNISOLONE ACETATE 80 MG/ML IJ SUSP
120.0000 mg | Freq: Once | INTRAMUSCULAR | Status: AC
  Administered 2016-12-19: 120 mg via INTRAMUSCULAR

## 2016-12-19 NOTE — Addendum Note (Signed)
Addended by: Aggie Hacker A on: 12/19/2016 03:33 PM   Modules accepted: Orders

## 2016-12-19 NOTE — Progress Notes (Signed)
   Subjective:    Patient ID: Cody Wyatt, male    DOB: Jul 01, 1971, 46 y.o.   MRN: [id number]  HPI Here for one week of itchy eyes, PND, scratchy throat, and a dry cough. No fever. Using Claritin.    Review of Systems  Constitutional: Negative.   HENT: Positive for congestion, postnasal drip, rhinorrhea and sore throat. Negative for sinus pain and sinus pressure.   Eyes: Positive for itching. Negative for discharge.  Respiratory: Positive for cough. Negative for shortness of breath and wheezing.        Objective:   Physical Exam  Constitutional: He appears well-developed and well-nourished.  HENT:  Right Ear: External ear normal.  Left Ear: External ear normal.  Nose: Nose normal.  Mouth/Throat: Oropharynx is clear and moist.  Eyes: Conjunctivae are normal.  Neck: No thyromegaly present.  Pulmonary/Chest: Effort normal and breath sounds normal.  Lymphadenopathy:    He has no cervical adenopathy.          Assessment & Plan:  Seasonal allergies. Given a steroid shot. Try Heloise Ochoa, MD

## 2016-12-19 NOTE — Progress Notes (Signed)
Pre visit review using our clinic review tool, if applicable. No additional management support is needed unless otherwise documented below in the visit note. 

## 2016-12-19 NOTE — Patient Instructions (Signed)
WE NOW OFFER   [facility] FAST TRACK!!!  SAME DAY Appointments for ACUTE CARE  Such as: Sprains, Injuries, cuts, abrasions, rashes, muscle pain, joint pain, back pain Colds, flu, sore throats, headache, allergies, cough, fever  Ear pain, sinus and eye infections Abdominal pain, nausea, vomiting, diarrhea, upset stomach Animal/insect bites  3 Easy Ways to Schedule: Walk-In Scheduling Call in scheduling Mychart Sign-up: [email or web address]         

## 2017-01-17 ENCOUNTER — Telehealth: Payer: Self-pay | Admitting: Family Medicine

## 2017-01-17 NOTE — Telephone Encounter (Signed)
Can we refill this? 

## 2017-01-17 NOTE — Telephone Encounter (Signed)
Pt request refill  hydrocortisone-pramoxine (ANALPRAM-HC) 2.5-1 % rectal cream   [id number] [address], [place] - [place]

## 2017-01-18 MED ORDER — HYDROCORTISONE ACE-PRAMOXINE 2.5-1 % RE CREA: TOPICAL_CREAM | RECTAL | 5 refills | Status: DC

## 2017-01-18 NOTE — Telephone Encounter (Signed)
Call in another tube with 5 rf

## 2017-01-18 NOTE — Telephone Encounter (Signed)
[facility] 

## 2017-02-26 ENCOUNTER — Encounter: Payer: Self-pay | Admitting: Family Medicine

## 2017-02-26 ENCOUNTER — Ambulatory Visit (INDEPENDENT_AMBULATORY_CARE_PROVIDER_SITE_OTHER): Payer: 59 | Admitting: Family Medicine

## 2017-02-26 VITALS — BP 116/75 | HR 60 | Temp 98.0°F | Ht 68.25 in | Wt 181.0 lb

## 2017-02-26 DIAGNOSIS — R1013 Epigastric pain: Secondary | ICD-10-CM

## 2017-02-26 NOTE — Progress Notes (Signed)
   Subjective:    Patient ID: Cody Wyatt, male    DOB: 08-02-1971, 46 y.o.   MRN: [id number]  HPI Here for 2 weeks of intermittent burning pains in the epigastrium. No nausea or trouble swallowing. BMs are normal. Pepto-Bismol does not help much. No recent change in diet.    Review of Systems  Constitutional: Negative.   Respiratory: Negative.   Cardiovascular: Negative.   Gastrointestinal: Positive for abdominal pain. Negative for abdominal distention, anal bleeding, blood in stool, constipation, diarrhea, nausea, rectal pain and vomiting.  Genitourinary: Negative.        Objective:   Physical Exam  Constitutional: He appears well-developed and well-nourished.  Cardiovascular: Normal rate, regular rhythm, normal heart sounds and intact distal pulses.   Pulmonary/Chest: Effort normal and breath sounds normal. No respiratory distress. He has no wheezes. He has no rales.  Abdominal: Soft. Bowel sounds are normal. He exhibits no distension and no mass. There is no tenderness. There is no rebound and no guarding.          Assessment & Plan:  Epigastric pain, likely due to gastritis. Try Omeprazole 20 mg daily for one month.  Alysia Penna, MD

## 2017-02-26 NOTE — Patient Instructions (Signed)
WE NOW OFFER   [facility] FAST TRACK!!!  SAME DAY Appointments for ACUTE CARE  Such as: Sprains, Injuries, cuts, abrasions, rashes, muscle pain, joint pain, back pain Colds, flu, sore throats, headache, allergies, cough, fever  Ear pain, sinus and eye infections Abdominal pain, nausea, vomiting, diarrhea, upset stomach Animal/insect bites  3 Easy Ways to Schedule: Walk-In Scheduling Call in scheduling Mychart Sign-up: [email or web address]         

## 2017-04-12 ENCOUNTER — Telehealth: Payer: Self-pay | Admitting: Family Medicine

## 2017-04-12 MED ORDER — OMEPRAZOLE 40 MG PO CPDR: 40.0000 mg | DELAYED_RELEASE_CAPSULE | Freq: Every day | ORAL | 3 refills | Status: DC

## 2017-04-12 NOTE — Telephone Encounter (Signed)
[facility] and left a voice message for pt with this information.

## 2017-04-12 NOTE — Telephone Encounter (Signed)
Call in Omeprazole 40 mg daily ,#90 with 3 rf 

## 2017-04-12 NOTE — Telephone Encounter (Signed)
Pt state that the OTC medication for acid reflux is not working and would like to see if he can get something that will help with the acid reflux.  [facility]  [place]

## 2017-05-09 ENCOUNTER — Encounter: Payer: Self-pay | Admitting: Family Medicine

## 2017-07-04 ENCOUNTER — Other Ambulatory Visit: Payer: Self-pay | Admitting: Family Medicine

## 2017-07-04 NOTE — Telephone Encounter (Signed)
Call in #60 with 2 rf 

## 2017-07-22 NOTE — Progress Notes (Signed)
Cody Wyatt received his flu shot on 07/22/17 at the [facility] to LT deltoid. Lot # [id number] NDC: [phone] Mfg: [employer] Expires: 02/16/18

## 2017-08-23 ENCOUNTER — Ambulatory Visit: Payer: Commercial Managed Care - PPO | Admitting: Family Medicine

## 2017-08-23 ENCOUNTER — Encounter: Payer: Self-pay | Admitting: Family Medicine

## 2017-08-23 VITALS — BP 122/84 | HR 62 | Temp 98.4°F | Resp 12 | Ht 68.25 in | Wt 180.2 lb

## 2017-08-23 DIAGNOSIS — J040 Acute laryngitis: Secondary | ICD-10-CM | POA: Diagnosis not present

## 2017-08-23 DIAGNOSIS — J069 Acute upper respiratory infection, unspecified: Secondary | ICD-10-CM

## 2017-08-23 DIAGNOSIS — J309 Allergic rhinitis, unspecified: Secondary | ICD-10-CM | POA: Diagnosis not present

## 2017-08-23 MED ORDER — FLUTICASONE PROPIONATE 50 MCG/ACT NA SUSP: 1.0000 | Freq: Two times a day (BID) | NASAL | 1 refills | Status: DC

## 2017-08-23 MED ORDER — BENZONATATE 100 MG PO CAPS: 200.0000 mg | ORAL_CAPSULE | Freq: Two times a day (BID) | ORAL | 0 refills | Status: AC | PRN

## 2017-08-23 MED ORDER — DOXYCYCLINE HYCLATE 100 MG PO TABS: 100.0000 mg | ORAL_TABLET | Freq: Two times a day (BID) | ORAL | 0 refills | Status: AC

## 2017-08-23 MED ORDER — PREDNISONE 20 MG PO TABS: 40.0000 mg | ORAL_TABLET | Freq: Every day | ORAL | 0 refills | Status: AC

## 2017-08-23 NOTE — Patient Instructions (Addendum)
  Cody Wyatt I have seen you today for an acute visit.  A few things to remember from today's visit:   No diagnosis found.   Medications prescribed today are intended for short period of time and will not be refill upon request, a follow up appointment might be necessary to discuss continuation of of treatment if appropriate.   viral infections are self-limited and we treat each symptom depending of severity.  Over the counter medications as decongestants and cold medications usually help, they need to be taken with caution if there is a history of high blood pressure or palpitations. Tylenol and/or Ibuprofen also helps with most symptoms (headache, muscle aching, fever,etc) Plenty of fluids. Honey helps with cough. Steam inhalations helps with runny nose, nasal congestion, and may prevent sinus infections. Cough and nasal congestion could last a few days and sometimes weeks. Please follow in not any better in 1-2 weeks or if symptoms get worse.  Start antibiotic chills if symptoms get worse or fever develops. Voice rest.   Laryngitis Laryngitis is swelling (inflammation) of your vocal cords. This causes hoarseness, coughing, loss of voice, sore throat, or a dry throat. When your vocal cords are inflamed, your voice sounds different. Laryngitis can be temporary (acute) or long-term (chronic). Most cases of acute laryngitis improve with time. Chronic laryngitis is laryngitis that lasts for more than three weeks. Follow these instructions at home:  Drink enough fluid to keep your pee (urine) clear or pale yellow.  Breathe in moist air. Use a humidifier if you live in a dry climate.  Take medicines only as told by your doctor.  Do not smoke cigarettes or electronic cigarettes. If you need help quitting, ask your doctor.  Talk as little as possible. Also avoid whispering, which can cause vocal strain.  Write instead of talking. Do this until your voice is back to  normal. Contact a doctor if:  You have a fever.  Your pain is worse.  You have trouble swallowing. Get help right away if:  You cough up blood.  You have trouble breathing. This information is not intended to replace advice given to you by your health care provider. Make sure you discuss any questions you have with your health care provider. Document Released: 07/26/2011 Document Revised: 01/12/2016 Document Reviewed: 01/19/2014 Elsevier Interactive Patient Education  [employer].   In general please monitor for signs of worsening symptoms and seek immediate medical attention if any concerning.  If symptoms are not resolved in 2-3 weeks you should schedule a follow up appointment with your doctor, before if needed.  I hope you get better soon!

## 2017-08-23 NOTE — Progress Notes (Signed)
ACUTE VISIT  HPI:  Chief Complaint  Patient presents with  . Cough    started 08/19/17  . Nasal Congestion  . Laryngitis    [name] is a 47 y.o.male here today complaining of 5 days of respiratory symptoms. He is planning on traveling to [place] in a week and he is afraid symptoms will get worse while he is there.   URI   This is a new problem. The current episode started in the past 7 days. The problem has been gradually worsening. There has been no fever. Associated symptoms include congestion, coughing, rhinorrhea and a sore throat. Pertinent negatives include no abdominal pain, diarrhea, ear pain, headaches, joint pain, joint swelling, nausea, neck pain, plugged ear sensation, rash, swollen glands, vomiting or wheezing. He has tried decongestant and antihistamine for the symptoms. The treatment provided mild relief.    Nonproductive cough.  No Hx of recent travel. Sick contact, his daughter was sick a few days ago with similar symptoms. No known insect bite.  Hx of allergies: Allergic rhinitis,he takes Xyzal 5 mg daily.  OTC medications for this problem: Alka-Seltzer.  Most symptoms otherwise stable he feels like dysphonia is getting worse.  He denies any stridor, dysphagia, wheezing, or dyspnea.   Review of Systems  Constitutional: Positive for activity change and fatigue. Negative for appetite change, chills and fever.  HENT: Positive for congestion, postnasal drip, rhinorrhea, sinus pressure, sore throat and voice change. Negative for ear pain, facial swelling, mouth sores, nosebleeds and trouble swallowing.   Eyes: Negative for discharge, redness and itching.  Respiratory: Positive for cough. Negative for chest tightness, shortness of breath and wheezing.   Cardiovascular: Negative for leg swelling.  Gastrointestinal: Negative for abdominal pain, diarrhea, nausea and vomiting.  Musculoskeletal: Negative for joint pain, myalgias and neck pain.    Skin: Negative for rash.  Allergic/Immunologic: Positive for environmental allergies.  Neurological: Negative for syncope, weakness and headaches.  Hematological: Negative for adenopathy. Does not bruise/bleed easily.  Psychiatric/Behavioral: Negative for confusion. The patient is nervous/anxious.       Current Outpatient Medications on File Prior to Visit  Medication Sig Dispense Refill  . aluminum chloride (DRYSOL) 20 % external solution Apply 1 application topically at bedtime. (Patient taking differently: Apply 1 application topically at bedtime. ) 60 mL 11  . clonazePAM (KLONOPIN) 1 MG tablet TAKE 1 TABLET BY MOUTH TWICE A DAY AS NEEDED FOR ANXIETY 60 tablet 5  . hydrocortisone-pramoxine (ANALPRAM-HC) 2.5-1 % rectal cream INSERT 1 APPLICATORFUL RECTALLY 3 TIMES A DAY (Patient taking differently: INSERT 1 APPLICATORFUL RECTALLY 3 TIMES A DAY) 30 g 5  . metaxalone (SKELAXIN) 800 MG tablet Take 1 tablet (800 mg total) by mouth 3 (three) times daily as needed. 270 tablet 3  . omeprazole (PRILOSEC) 40 MG capsule Take 1 capsule (40 mg total) by mouth daily. 90 capsule 3  . temazepam (RESTORIL) 15 MG capsule TAKE 2 CAPSULES BY MOUTH AT BEDTIME AS NEEDED 60 capsule 2   No current facility-administered medications on file prior to visit.      Past Medical History:  Diagnosis Date  . Anal fissure   . Anxiety   . Hyperhydrosis disorder   . Insomnia   . Low back pain    No Known Allergies  Social History   Socioeconomic History  . Marital status: Married    Spouse name: None  . Number of children: None  . Years of education: None  . Highest  education level: None  Social Needs  . Financial resource strain: None  . Food insecurity - worry: None  . Food insecurity - inability: None  . Transportation needs - medical: None  . Transportation needs - non-medical: None  Occupational History  . None  Tobacco Use  . Smoking status: Never Smoker  . Smokeless tobacco: Never Used   Substance and Sexual Activity  . Alcohol use: Yes    Alcohol/week: 0.0 oz    Comment: occ  . Drug use: No  . Sexual activity: None  Other Topics Concern  . None  Social History Narrative  . None    Vitals:   08/23/17 0753  BP: 122/84  Pulse: 62  Resp: 12  Temp: 98.4 F (36.9 C)  SpO2: 96%   Body mass index is 27.21 kg/m.   Physical Exam  Nursing note and vitals reviewed. Constitutional: He is oriented to person, place, and time. He appears well-developed. He does not appear ill. No distress.  HENT:  Head: Normocephalic and atraumatic.  Right Ear: Tympanic membrane, external ear and ear canal normal.  Left Ear: Tympanic membrane, external ear and ear canal normal.  Nose: Rhinorrhea present. Right sinus exhibits no maxillary sinus tenderness and no frontal sinus tenderness. Left sinus exhibits no maxillary sinus tenderness and no frontal sinus tenderness.  Mouth/Throat: Oropharynx is clear and moist and mucous membranes are normal.  Moderate dysphonia. Normal sinus transillumination.   Eyes: Conjunctivae and EOM are normal.  Cardiovascular: Normal rate and regular rhythm.  No murmur heard. Respiratory: Effort normal and breath sounds normal. No stridor. No respiratory distress.  Musculoskeletal: He exhibits no edema.  Lymphadenopathy:       Head (right side): No submandibular adenopathy present.       Head (left side): No submandibular adenopathy present.    He has no cervical adenopathy.  Neurological: He is alert and oriented to person, place, and time. He has normal strength.  Skin: Skin is warm. No rash noted. No erythema.  Psychiatric: His speech is normal. His mood appears anxious.  Well groomed, good eye contact.    ASSESSMENT AND PLAN:  Cody Wyatt Wyatt was seen today for cough, nasal congestion and laryngitis.  Diagnoses and all orders for this visit:  Allergic rhinitis, unspecified seasonality, unspecified trigger  Problem can be aggravated by viral  illness. Symptomatic treatment recommended with intranasal steroid. Oral Prednisone might also help, some side effects discussed. I also recommended OTC decongestant, outoinflation maneuvers, and chewing gum/eating hard candy while flying to [place] in a week to try to prevent barometric changes.  -     fluticasone (FLONASE) 50 MCG/ACT nasal spray; Place 1 spray into both nostrils 2 (two) times daily. -     predniSONE (DELTASONE) 20 MG tablet; Take 2 tablets (40 mg total) by mouth daily with breakfast for 3 days.  URI, acute  Symptoms suggests a viral etiology, I explained that symptomatic treatment is usually recommended in this case, so I do not think abx is needed at this time.  But prescription for Doxycycline was given, so he can start if symptoms get worse or if he develops fever.  We discussed side effects and adverse effects of antibiotic treatment.  Instructed to monitor for signs of complications, including new onset of fever among some, clearly instructed about warning signs. I also explained that cough and nasal congestion can last a few days and sometimes weeks. F/U as needed.  -     benzonatate (TESSALON) 100 MG capsule;  Take 2 capsules (200 mg total) by mouth 2 (two) times daily as needed for up to 10 days.  Acute laryngitis  Educated about diagnosis and treatment options. Voice rest. Prednisone might help, even though it is not usually recommended for laryngitis treatment. Instructed about warning signs.  Other orders -     doxycycline (VIBRA-TABS) 100 MG tablet; Take 1 tablet (100 mg total) by mouth 2 (two) times daily for 7 days.   -Mr. Cody Wyatt Wyatt advised to seek attention immediately if symptoms worsen or to follow if they persist or new concerns arise.       Sven Pinheiro G. [place], MD  [place]. [place] office.

## 2017-10-08 ENCOUNTER — Encounter: Payer: Self-pay | Admitting: Family Medicine

## 2017-11-01 ENCOUNTER — Telehealth: Payer: Self-pay | Admitting: Family Medicine

## 2017-11-01 NOTE — Telephone Encounter (Signed)
Copied from [place]. Topic: Quick Communication - See Telephone Encounter >> Nov 01, 2017  9:30 AM Aurelio Brash B wrote: CRM for notification. See Telephone encounter for:  PT states he use to have a rx for  heavy perspiration  prescribed aprox 7 yrs ago and wants to know if he can get another Rx for that,  he does not know the name but says it was a  roll on   [id number] [place], [place] - [address] [phone] (Phone) [phone] (Fax)   11/01/17.

## 2017-11-01 NOTE — Telephone Encounter (Signed)
Last OV 02/26/2017   Last refilled 07/05/2017 disp 60 with 2 refills

## 2017-11-01 NOTE — Telephone Encounter (Signed)
[place], [place] [address]  [place]

## 2017-11-01 NOTE — Telephone Encounter (Signed)
Called pt and left a VM asking for them to try and contact their pharmacy to try and find out the name of the medication so we can send in what worked for the pt.

## 2017-11-05 NOTE — Telephone Encounter (Signed)
Rx was called into pharmacy [facility].   Could you help me close this out? Having issues. Message states that [facility].

## 2017-11-05 NOTE — Telephone Encounter (Signed)
Call in #60 with 5 rf 

## 2017-11-07 NOTE — Telephone Encounter (Signed)
This is Drysol 20% solution to apply daily, call in 90 days supply with 3 rf

## 2017-11-07 NOTE — Telephone Encounter (Signed)
Called and spoke with pt he stated that he tried to find out the name of the medication but he could NOT. Pt stated that the heavy perspiration was a liquid that he put under his under arms. Pt stated that if Dr. Sarajane Jews is unable to recall the name of this medication he will discuss this with him at his next OV.   Sent to PCP

## 2017-11-08 MED ORDER — TEMAZEPAM 15 MG PO CAPS: ORAL_CAPSULE | ORAL | 5 refills | Status: DC

## 2017-11-08 MED ORDER — ALUMINUM CHLORIDE 20 % EX SOLN: CUTANEOUS | 3 refills | Status: DC

## 2017-11-08 NOTE — Addendum Note (Signed)
Addended by: Myriam Forehand on: 11/08/2017 04:31 PM   Modules accepted: Orders

## 2017-11-08 NOTE — Telephone Encounter (Signed)
[facility] Called [facility] and Rx was canceled/removed. Called pt and left a VM to advise.

## 2017-11-08 NOTE — Telephone Encounter (Signed)
Rx has been sent in to pharmacy. 

## 2017-11-08 NOTE — Telephone Encounter (Signed)
[facility], requesting call back [phone]

## 2017-11-08 NOTE — Telephone Encounter (Signed)
Last OV 08/23/2017 with [place]   Last refilled

## 2017-11-08 NOTE — Telephone Encounter (Signed)
Original request refused, new request ordered and signed per IT instructions.

## 2017-11-08 NOTE — Telephone Encounter (Signed)
Called pt to advised left a a detailed VM.

## 2017-11-08 NOTE — Telephone Encounter (Signed)
Pt would like the temazepam (RESTORIL) 15 MG capsule  [facility] [address] [facility] of the [facility]

## 2017-11-08 NOTE — Telephone Encounter (Signed)
Called patient to verify where rx should be sent, since we thought request was received from [facility] from [facility]. Patient stated he has never used [facility] in [facility], and [facility] [address].  [facility] canceled at [facility] [address]

## 2018-01-10 ENCOUNTER — Other Ambulatory Visit: Payer: Self-pay | Admitting: Family Medicine

## 2018-01-20 ENCOUNTER — Other Ambulatory Visit: Payer: Self-pay | Admitting: Family Medicine

## 2018-01-20 NOTE — Telephone Encounter (Signed)
Last OV 08/23/2017   Last refilled 12/06/2016 disp 60 with 5 refills   Sent to PCP for approval

## 2018-01-21 NOTE — Telephone Encounter (Signed)
Call in #60 with 5 rf 

## 2018-01-28 ENCOUNTER — Ambulatory Visit: Payer: Self-pay

## 2018-01-28 NOTE — Telephone Encounter (Signed)
Patient called in with c/o "dizziness, headache." He says "the dizziness started about 2 weeks ago, it's mild and doesn't interfere with my work, I actually drove home from [place] today. It's just nagging with me and I figured I need to be seen. 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 I checked my BP this morning and it was 111/80, pulse 68." I asked about other symptoms, he denies. According to protocol, see PCP within 3 days, appointment scheduled for tomorrow at 0930 with Dr. Sarajane Jews, care advice given, patient verbalized understanding.   Reason for Disposition . [1] MILD dizziness (e.g., walking normally) AND [2] has NOT been evaluated by physician for this  (Exception: dizziness caused by heat exposure, sudden standing, or poor fluid intake)  Answer Assessment - Initial Assessment Questions 1. DESCRIPTION: "Describe your dizziness."     Lightheaded 2. LIGHTHEADED: "Do you feel lightheaded?" (e.g., somewhat faint, woozy, weak upon standing)     Yes, not affecting me in any way 3. VERTIGO: "Do you feel like either you or the room is spinning or tilting?" (i.e. vertigo)     No 4. SEVERITY: "How bad is it?"  "Do you feel like you are going to faint?" "Can you stand and walk?"   - MILD - walking normally   - MODERATE - interferes with normal activities (e.g., work, school)    - SEVERE - unable to stand, requires support to walk, feels like passing out now.      Mild 5. ONSET:  "When did the dizziness begin?"     Past 2 weeks 6. AGGRAVATING FACTORS: "Does anything make it worse?" (e.g., standing, change in head position)     Being in front of the computer all day makes it worse 7. HEART RATE: "Can you tell me your heart rate?" "How many beats in 15 seconds?"  (Note: not all patients can do this)       68 this morning 8. CAUSE: "What do you think is causing the dizziness?"     I don't know 9. RECURRENT SYMPTOM: "Have you had dizziness  before?" If so, ask: "When was the last time?" "What happened that time?"     Yes, but not this long 10. OTHER SYMPTOMS: "Do you have any other symptoms?" (e.g., fever, chest pain, vomiting, diarrhea, bleeding)       Headache 11. PREGNANCY: "Is there any chance you are pregnant?" "When was your last menstrual period?"       N/A  Protocols used: DIZZINESS [facility]

## 2018-01-29 ENCOUNTER — Ambulatory Visit: Payer: Commercial Managed Care - PPO | Admitting: Family Medicine

## 2018-01-29 ENCOUNTER — Encounter: Payer: Self-pay | Admitting: Family Medicine

## 2018-01-29 VITALS — BP 112/72 | HR 62 | Temp 98.0°F | Ht 68.25 in | Wt 179.2 lb

## 2018-01-29 DIAGNOSIS — R51 Headache: Secondary | ICD-10-CM

## 2018-01-29 DIAGNOSIS — R42 Dizziness and giddiness: Secondary | ICD-10-CM

## 2018-01-29 DIAGNOSIS — R519 Headache, unspecified: Secondary | ICD-10-CM

## 2018-01-29 NOTE — Progress Notes (Signed)
   Subjective:    Patient ID: Cody Wyatt, male    DOB: [birth year], 47 y.o.   MRN: [id number]  HPI Here to discuss 2 weeks of intermittent dizziness and mild headaches. These are quite mild. No sinus congestion or SOB or chest pains. He has a BP monitor at home and he ha been getting readings of 120-140 over 80s. He started on a low carb diet about 2 months ago and has lost a few pounds. He is eating primarily meats and vegetables, no grains or fruits. He had a wellness screen at work in February and we received a copy of his lab results. These showed normal renal function and TSH, and his A1c was 5.3. He notes that some years ago he had [place] surgery on both eyes, and they attempted to do a full correction on the right eye and a partial correction on the left eye. After that he has worn a contact lens in the left eye. This worked well until about a year ago when he began to have difficulty reading up close or working on a computer. He began to wear OTC readers when working, and he has had a little trouble adjusting to this. Then he had a full eye exam 2 weeks ago, and they decided to change things up. He now does not wear the left eye contact lens but wears prescription glasses most of the time, such when driving. He also now wears prescription readers. He thinks his headaches and dizziness are the result of adjusting to these changes. Today he feels fine.    Review of Systems  Constitutional: Negative.   HENT: Negative.   Eyes: Negative.   Respiratory: Negative.   Cardiovascular: Negative.   Neurological: Positive for dizziness and headaches.       Objective:   Physical Exam  Constitutional: He is oriented to person, place, and time. He appears well-developed and well-nourished.  HENT:  Head: Normocephalic and atraumatic.  Right Ear: External ear normal.  Left Ear: External ear normal.  Nose: Nose normal.  Mouth/Throat: Oropharynx is clear and moist.  Eyes: Pupils are equal, round, and  reactive to light. Conjunctivae and EOM are normal.  Neck: Neck supple. No thyromegaly present.  Cardiovascular: Normal rate, regular rhythm, normal heart sounds and intact distal pulses.  Pulmonary/Chest: Effort normal and breath sounds normal.  Lymphadenopathy:    He has no cervical adenopathy.  Neurological: He is alert and oriented to person, place, and time. No cranial nerve deficit. He exhibits normal muscle tone. Coordination normal.          Assessment & Plan:  He has mild intermittent headaches and dizziness, and I agree with him that these are probably the result of his brain adjusting to the new glasses. This should go away over the next few weeks. He will set up a wellness exam with labs with [place] for 3-4 weeks from now. We will re-evaluate these symptoms at that time.  Alysia Penna, MD

## 2018-02-28 ENCOUNTER — Encounter: Payer: Self-pay | Admitting: Family Medicine

## 2018-02-28 ENCOUNTER — Ambulatory Visit (INDEPENDENT_AMBULATORY_CARE_PROVIDER_SITE_OTHER): Payer: Commercial Managed Care - PPO | Admitting: Family Medicine

## 2018-02-28 VITALS — BP 116/68 | HR 62 | Temp 97.7°F | Ht 68.0 in | Wt 180.8 lb

## 2018-02-28 DIAGNOSIS — Z0000 Encounter for general adult medical examination without abnormal findings: Secondary | ICD-10-CM | POA: Diagnosis not present

## 2018-02-28 LAB — BASIC METABOLIC PANEL
BUN: 16 mg/dL (ref 6–23)
CALCIUM: 9.3 mg/dL (ref 8.4–10.5)
CHLORIDE: 100 meq/L (ref 96–112)
CO2: 28 meq/L (ref 19–32)
CREATININE: 0.92 mg/dL (ref 0.40–1.50)
GFR: 93.08 mL/min (ref >60.00)
Glucose, Bld: 97 mg/dL (ref 70–99)
Potassium: 4.3 mEq/L (ref 3.5–5.1)
Sodium: 137 mEq/L (ref 135–145)

## 2018-02-28 LAB — CBC WITH DIFFERENTIAL/PLATELET
BASOS ABS: 0 10*3/uL (ref 0.0–0.1)
Basophils Relative: 1 % (ref 0.0–3.0)
EOS ABS: 0.3 10*3/uL (ref 0.0–0.7)
EOS PCT: 7.6 % — AB (ref 0.0–5.0)
HCT: 45.4 % (ref 39.0–52.0)
HEMOGLOBIN: 15.8 g/dL (ref 13.0–17.0)
LYMPHS ABS: 1.1 10*3/uL (ref 0.7–4.0)
Lymphocytes Relative: 25.3 % (ref 12.0–46.0)
MCHC: 34.7 g/dL (ref 30.0–36.0)
MCV: 88.2 fl (ref 78.0–100.0)
Monocytes Absolute: 0.4 10*3/uL (ref 0.1–1.0)
Monocytes Relative: 9.1 % (ref 3.0–12.0)
NEUTROS PCT: 57 % (ref 43.0–77.0)
Neutro Abs: 2.5 10*3/uL (ref 1.4–7.7)
Platelets: 197 10*3/uL (ref 150.0–400.0)
RBC: 5.14 Mil/uL (ref 4.22–5.81)
RDW: 13.7 % (ref 11.5–15.5)
WBC: 4.5 10*3/uL (ref 4.0–10.5)

## 2018-02-28 LAB — HEPATIC FUNCTION PANEL
ALBUMIN: 4.4 g/dL (ref 3.5–5.2)
ALT: 18 U/L (ref 0–53)
AST: 20 U/L (ref 0–37)
Alkaline Phosphatase: 35 U/L — ABNORMAL LOW (ref 39–117)
BILIRUBIN DIRECT: 0.1 mg/dL (ref 0.0–0.3)
TOTAL PROTEIN: 6.9 g/dL (ref 6.0–8.3)
Total Bilirubin: 0.8 mg/dL (ref 0.2–1.2)

## 2018-02-28 LAB — LIPID PANEL
CHOL/HDL RATIO: 3
Cholesterol: 298 mg/dL — ABNORMAL HIGH (ref 0–200)
HDL: 88.8 mg/dL (ref >39.00)
LDL Cholesterol: 196 mg/dL — ABNORMAL HIGH (ref 0–99)
NONHDL: 209.52
Triglycerides: 70 mg/dL (ref 0.0–149.0)
VLDL: 14 mg/dL (ref 0.0–40.0)

## 2018-02-28 LAB — POC URINALSYSI DIPSTICK (AUTOMATED)
Bilirubin, UA: NEGATIVE
Glucose, UA: NEGATIVE
KETONES UA: NEGATIVE
Leukocytes, UA: NEGATIVE
Nitrite, UA: NEGATIVE
PH UA: 6 (ref 5.0–8.0)
Protein, UA: NEGATIVE
RBC UA: NEGATIVE
SPEC GRAV UA: 1.015 (ref 1.010–1.025)
UROBILINOGEN UA: 0.2 U/dL

## 2018-02-28 LAB — TSH: TSH: 1.73 u[IU]/mL (ref 0.35–4.50)

## 2018-02-28 NOTE — Progress Notes (Signed)
   Subjective:    Patient ID: Cody Wyatt, male    DOB: 05/10/1971, 47 y.o.   MRN: [id number]  HPI Here for a well exam. He feels fine. He was here a few weeks ago for dizziness but this resolved.    Review of Systems  Constitutional: Negative.   HENT: Negative.   Eyes: Negative.   Respiratory: Negative.   Cardiovascular: Negative.   Gastrointestinal: Negative.   Genitourinary: Negative.   Musculoskeletal: Negative.   Skin: Negative.   Neurological: Negative.   Psychiatric/Behavioral: Negative.        Objective:   Physical Exam  Constitutional: He is oriented to person, place, and time. He appears well-developed and well-nourished. No distress.  HENT:  Head: Normocephalic and atraumatic.  Right Ear: External ear normal.  Left Ear: External ear normal.  Nose: Nose normal.  Mouth/Throat: Oropharynx is clear and moist. No oropharyngeal exudate.  Eyes: Pupils are equal, round, and reactive to light. Conjunctivae and EOM are normal. Right eye exhibits no discharge. Left eye exhibits no discharge. No scleral icterus.  Neck: Neck supple. No JVD present. No tracheal deviation present. No thyromegaly present.  Cardiovascular: Normal rate, regular rhythm, normal heart sounds and intact distal pulses. Exam reveals no gallop and no friction rub.  No murmur heard. Pulmonary/Chest: Effort normal and breath sounds normal. No respiratory distress. He has no wheezes. He has no rales. He exhibits no tenderness.  Abdominal: Soft. Bowel sounds are normal. He exhibits no distension and no mass. There is no tenderness. There is no rebound and no guarding.  Genitourinary: Penis normal. No penile tenderness.  Musculoskeletal: Normal range of motion. He exhibits no edema or tenderness.  Lymphadenopathy:    He has no cervical adenopathy.  Neurological: He is alert and oriented to person, place, and time. He has normal reflexes. He displays normal reflexes. No cranial nerve deficit. He exhibits  normal muscle tone. Coordination normal.  Skin: Skin is warm and dry. No rash noted. He is not diaphoretic. No erythema. No pallor.  Psychiatric: He has a normal mood and affect. His behavior is normal. Judgment and thought content normal.          Assessment & Plan:  Well exam. We discussed diet and exercise. Get fasting labs.  Alysia Penna, MD

## 2018-03-06 ENCOUNTER — Telehealth: Payer: Self-pay | Admitting: Family Medicine

## 2018-03-06 NOTE — Telephone Encounter (Signed)
Copied from [id number]. Topic: Quick Communication - See Telephone Encounter >> Mar 06, 2018 12:26 PM Synthia Innocent wrote: CRM for notification. See Telephone encounter for: 03/06/18. Patient is concerned regarding LCL results. Would like to speak with provider.

## 2018-03-07 NOTE — Telephone Encounter (Signed)
Called and spoke with pt. Pt stated that he had his lipid panel tested in 09/2017 @ [facility] through his job and it was 94 LDL and 7 days ago his LDL was 196 when tested here. Pt was concern of either false results or is it possible for it increase that much in that time frame?   Sent to PCP to advise

## 2018-03-07 NOTE — Telephone Encounter (Signed)
He is correct that it probably did not change that much. My first question is when we drew it, was he truly fasting for 8 hours?

## 2018-04-14 DIAGNOSIS — H04123 Dry eye syndrome of bilateral lacrimal glands: Secondary | ICD-10-CM | POA: Diagnosis not present

## 2018-04-14 DIAGNOSIS — Z9889 Other specified postprocedural states: Secondary | ICD-10-CM | POA: Diagnosis not present

## 2018-06-09 ENCOUNTER — Other Ambulatory Visit: Payer: Self-pay | Admitting: Family Medicine

## 2018-06-09 NOTE — Telephone Encounter (Signed)
Dr. Fry please advise on refills thanks  

## 2018-06-10 NOTE — Telephone Encounter (Signed)
Call in #60 with 5 rf 

## 2018-07-15 ENCOUNTER — Other Ambulatory Visit: Payer: Self-pay | Admitting: Family Medicine

## 2018-07-24 NOTE — Telephone Encounter (Signed)
Requested medication (s) are due for refill today: yes  Requested medication (s) are on the active medication list: yes    Last refill: 06/11/18  #60 5refills  Future visit scheduled no  Notes to clinic: NT called pharmacy. There are no refills available.  Requested Prescriptions  Pending Prescriptions Disp Refills   temazepam (RESTORIL) 15 MG capsule 60 capsule 5    Sig: TAKE 2 CAPSULES BY MOUTH AT BEDTIME AS NEEDED     Not Delegated - Psychiatry:  Anxiolytics/Hypnotics Failed - 07/24/2018  5:07 PM      Failed - This refill cannot be delegated      Failed - Urine Drug Screen completed in last 360 days.      Passed - Valid encounter within last 6 months    Recent Outpatient Visits          4 months ago Preventative health care   [place] at [place], Ishmael Holter, MD   5 months ago [place] at [place], MD   11 months ago Allergic rhinitis, unspecified seasonality, unspecified Scientist, clinical (histocompatibility and immunogenetics) at [facility] [place], Malka So, MD   1 year ago Epigastric pain   [place] at [place], Ishmael Holter, MD   1 year ago Environmental allergies   Therapist, music at [place], MD           Refused Prescriptions Disp Refills   temazepam (RESTORIL) 15 MG capsule [Pharmacy Med Name: TEMAZEPAM 15 MG CAPSULE] 60 capsule 0    Sig: TAKE 2 CAPSULES BY MOUTH AT BEDTIME AS NEEDED     Not Delegated - Psychiatry:  Anxiolytics/Hypnotics Failed - 07/24/2018  5:07 PM      Failed - This refill cannot be delegated      Failed - Urine Drug Screen completed in last 360 days.      Passed - Valid encounter within last 6 months    Recent Outpatient Visits          4 months ago Preventative health care   [place] at [place], MD   5 months ago [place] at [place], MD   11 months ago Allergic rhinitis, unspecified seasonality, unspecified  trigger   [place] at [facility] [place], Malka So, MD   1 year ago Epigastric pain   [place] at [place], Ishmael Holter, MD   1 year ago Environmental allergies   Therapist, music at [facility], Ishmael Holter, MD

## 2018-07-24 NOTE — Telephone Encounter (Signed)
Pt calling to check and see if he can get this refilled yet.  Pt states that he does still have 6-8 pills left. Pt can be reached at [phone]

## 2018-07-24 NOTE — Addendum Note (Signed)
Addended by: Kendrick Ranch on: 07/24/2018 05:07 PM   Modules accepted: Orders

## 2018-07-30 ENCOUNTER — Other Ambulatory Visit: Payer: Self-pay | Admitting: Family Medicine

## 2018-08-08 ENCOUNTER — Other Ambulatory Visit: Payer: Self-pay | Admitting: Family Medicine

## 2018-08-11 ENCOUNTER — Ambulatory Visit: Payer: Commercial Managed Care - PPO | Admitting: Family Medicine

## 2018-08-11 ENCOUNTER — Encounter: Payer: Self-pay | Admitting: Family Medicine

## 2018-08-11 VITALS — BP 132/76 | HR 61 | Temp 98.5°F | Ht 69.5 in | Wt 183.5 lb

## 2018-08-11 DIAGNOSIS — Z23 Encounter for immunization: Secondary | ICD-10-CM

## 2018-08-11 DIAGNOSIS — H6982 Other specified disorders of Eustachian tube, left ear: Secondary | ICD-10-CM

## 2018-08-11 DIAGNOSIS — E785 Hyperlipidemia, unspecified: Secondary | ICD-10-CM

## 2018-08-11 MED ORDER — HYDROCORTISONE ACE-PRAMOXINE 2.5-1 % RE CREA: TOPICAL_CREAM | RECTAL | 5 refills | Status: DC

## 2018-08-11 MED ORDER — SILDENAFIL CITRATE 100 MG PO TABS: 100.0000 mg | ORAL_TABLET | Freq: Every day | ORAL | 11 refills | Status: DC | PRN

## 2018-08-11 NOTE — Progress Notes (Signed)
   Subjective:    Patient ID: Cody Wyatt, male    DOB: 1971-03-22, 47 y.o.   MRN: [id number]  HPI Here to discuss recent labs. He had tried a ketogenic diet a few months ago and at his physical here on 02-28-18 his LDL has jumped up to 196. Since then he has gone back to his original diet and at a work screening recently the LDL was back down to his baseline of 110. He feels well. He asks about what do from here. He also asks about an intermittent sensation of water sloshing in the left ear. No pain and no trouble hearing.    Review of Systems  Constitutional: Negative.   HENT: Negative for congestion, ear discharge, ear pain, postnasal drip, sinus pressure, sinus pain and sore throat.   Eyes: Negative.   Respiratory: Negative.   Cardiovascular: Negative.        Objective:   Physical Exam Constitutional:      Appearance: Normal appearance.  HENT:     Right Ear: Tympanic membrane and ear canal normal.     Left Ear: Tympanic membrane and ear canal normal.     Nose: Nose normal.     Mouth/Throat:     Pharynx: Oropharynx is clear.  Eyes:     Conjunctiva/sclera: Conjunctivae normal.  Pulmonary:     Effort: Pulmonary effort is normal.     Breath sounds: Normal breath sounds.  Neurological:     Mental Status: He is alert.           Assessment & Plan:  He seems to have some eustachian tube dysfunction, and I recommended he get back on Claritin and Flonase. As for the lipids, he will continue to watch his diet closely.  Alysia Penna, MD

## 2018-09-06 ENCOUNTER — Encounter (HOSPITAL_COMMUNITY)
Admission: EM | Disposition: A | Discharge status: Home / Self Care | Payer: Self-pay | Source: Home / Self Care | Attending: Emergency Medicine

## 2018-09-06 ENCOUNTER — Other Ambulatory Visit: Payer: Self-pay

## 2018-09-06 ENCOUNTER — Emergency Department (HOSPITAL_COMMUNITY): Payer: Commercial Managed Care - PPO | Admitting: Certified Registered Nurse Anesthetist

## 2018-09-06 ENCOUNTER — Encounter (HOSPITAL_BASED_OUTPATIENT_CLINIC_OR_DEPARTMENT_OTHER): Payer: Self-pay | Admitting: Emergency Medicine

## 2018-09-06 ENCOUNTER — Observation Stay (HOSPITAL_BASED_OUTPATIENT_CLINIC_OR_DEPARTMENT_OTHER)
Admission: EM | Admit: 2018-09-06 | Discharge: 2018-09-07 | Disposition: A | Discharge status: Home / Self Care | Payer: Commercial Managed Care - PPO | Attending: Surgery | Admitting: Surgery

## 2018-09-06 ENCOUNTER — Emergency Department (HOSPITAL_BASED_OUTPATIENT_CLINIC_OR_DEPARTMENT_OTHER): Payer: Commercial Managed Care - PPO

## 2018-09-06 DIAGNOSIS — K3580 Unspecified acute appendicitis: Secondary | ICD-10-CM | POA: Diagnosis not present

## 2018-09-06 DIAGNOSIS — F329 Major depressive disorder, single episode, unspecified: Secondary | ICD-10-CM | POA: Insufficient documentation

## 2018-09-06 DIAGNOSIS — G4700 Insomnia, unspecified: Secondary | ICD-10-CM | POA: Diagnosis not present

## 2018-09-06 DIAGNOSIS — F419 Anxiety disorder, unspecified: Secondary | ICD-10-CM | POA: Diagnosis not present

## 2018-09-06 DIAGNOSIS — R1031 Right lower quadrant pain: Secondary | ICD-10-CM | POA: Diagnosis not present

## 2018-09-06 DIAGNOSIS — Z79899 Other long term (current) drug therapy: Secondary | ICD-10-CM | POA: Insufficient documentation

## 2018-09-06 HISTORY — PX: LAPAROSCOPIC APPENDECTOMY: SHX408

## 2018-09-06 LAB — COMPREHENSIVE METABOLIC PANEL
ALT: 24 U/L (ref 0–44)
AST: 22 U/L (ref 15–41)
Albumin: 3.9 g/dL (ref 3.5–5.0)
Alkaline Phosphatase: 38 U/L (ref 38–126)
Anion gap: 5 (ref 5–15)
BUN: 13 mg/dL (ref 6–20)
CO2: 28 mmol/L (ref 22–32)
Calcium: 8.9 mg/dL (ref 8.9–10.3)
Chloride: 102 mmol/L (ref 98–111)
Creatinine, Ser: 0.84 mg/dL (ref 0.61–1.24)
GFR calc non Af Amer: >60 mL/min (ref >60)
Glucose, Bld: 105 mg/dL — ABNORMAL HIGH (ref 70–99)
Potassium: 3.5 mmol/L (ref 3.5–5.1)
Sodium: 135 mmol/L (ref 135–145)
Total Bilirubin: 0.8 mg/dL (ref 0.3–1.2)
Total Protein: 6.6 g/dL (ref 6.5–8.1)

## 2018-09-06 LAB — URINALYSIS, ROUTINE W REFLEX MICROSCOPIC
Bilirubin Urine: NEGATIVE
GLUCOSE, UA: NEGATIVE mg/dL
Hgb urine dipstick: NEGATIVE
Ketones, ur: NEGATIVE mg/dL
LEUKOCYTES UA: NEGATIVE
Nitrite: NEGATIVE
PH: 7 (ref 5.0–8.0)
Protein, ur: NEGATIVE mg/dL
Specific Gravity, Urine: 1.01 (ref 1.005–1.030)

## 2018-09-06 LAB — CBC
HCT: 44.6 % (ref 39.0–52.0)
Hemoglobin: 14.9 g/dL (ref 13.0–17.0)
MCH: 30.3 pg (ref 26.0–34.0)
MCHC: 33.4 g/dL (ref 30.0–36.0)
MCV: 90.8 fL (ref 80.0–100.0)
Platelets: 224 10*3/uL (ref 150–400)
RBC: 4.91 MIL/uL (ref 4.22–5.81)
RDW: 12.7 % (ref 11.5–15.5)
WBC: 10.9 10*3/uL — ABNORMAL HIGH (ref 4.0–10.5)
nRBC: 0 % (ref 0.0–0.2)

## 2018-09-06 LAB — LIPASE, BLOOD: Lipase: 35 U/L (ref 11–51)

## 2018-09-06 SURGERY — APPENDECTOMY, LAPAROSCOPIC
Anesthesia: General | Site: Abdomen

## 2018-09-06 MED ORDER — KCL IN DEXTROSE-NACL 20-5-0.45 MEQ/L-%-% IV SOLN: INTRAVENOUS | Status: DC

## 2018-09-06 MED ORDER — ONDANSETRON HCL 4 MG/2ML IJ SOLN
4.0000 mg | Freq: Four times a day (QID) | INTRAMUSCULAR | Status: DC | PRN
  Administered 2018-09-07: 4 mg via INTRAVENOUS
  Filled 2018-09-06: qty 2

## 2018-09-06 MED ORDER — ONDANSETRON HCL 4 MG/2ML IJ SOLN: 4.0000 mg | Freq: Four times a day (QID) | INTRAMUSCULAR | Status: DC | PRN

## 2018-09-06 MED ORDER — FENTANYL CITRATE (PF) 100 MCG/2ML IJ SOLN
25.0000 ug | INTRAMUSCULAR | Status: DC | PRN
  Administered 2018-09-06 (×2): 25 ug via INTRAVENOUS

## 2018-09-06 MED ORDER — ACETAMINOPHEN 650 MG RE SUPP: 650.0000 mg | Freq: Four times a day (QID) | RECTAL | Status: DC | PRN

## 2018-09-06 MED ORDER — PROPOFOL 10 MG/ML IV BOLUS
INTRAVENOUS | Status: DC | PRN
  Administered 2018-09-06: 160 mg via INTRAVENOUS

## 2018-09-06 MED ORDER — BUPIVACAINE-EPINEPHRINE (PF) 0.25% -1:200000 IJ SOLN
INTRAMUSCULAR | Status: AC
  Filled 2018-09-06: qty 30

## 2018-09-06 MED ORDER — LACTATED RINGERS IV SOLN
INTRAVENOUS | Status: DC | PRN
  Administered 2018-09-06 (×2): via INTRAVENOUS

## 2018-09-06 MED ORDER — KCL IN DEXTROSE-NACL 20-5-0.45 MEQ/L-%-% IV SOLN
INTRAVENOUS | Status: DC
  Administered 2018-09-06: 19:00:00 via INTRAVENOUS
  Filled 2018-09-06: qty 1000

## 2018-09-06 MED ORDER — ONDANSETRON 4 MG PO TBDP: 4.0000 mg | ORAL_TABLET | Freq: Four times a day (QID) | ORAL | Status: DC | PRN

## 2018-09-06 MED ORDER — IOPAMIDOL (ISOVUE-300) INJECTION 61%
100.0000 mL | Freq: Once | INTRAVENOUS | Status: AC | PRN
  Administered 2018-09-06: 100 mL via INTRAVENOUS

## 2018-09-06 MED ORDER — HYDROCODONE-ACETAMINOPHEN 5-325 MG PO TABS
1.0000 | ORAL_TABLET | ORAL | Status: DC | PRN
  Administered 2018-09-07: 1 via ORAL
  Administered 2018-09-07: 2 via ORAL
  Filled 2018-09-06: qty 2
  Filled 2018-09-06: qty 1

## 2018-09-06 MED ORDER — HYDROMORPHONE HCL 1 MG/ML IJ SOLN
INTRAMUSCULAR | Status: AC
  Administered 2018-09-06: 1 mg
  Filled 2018-09-06: qty 1

## 2018-09-06 MED ORDER — ONDANSETRON HCL 4 MG/2ML IJ SOLN
INTRAMUSCULAR | Status: AC
  Administered 2018-09-06: 4 mg
  Filled 2018-09-06: qty 2

## 2018-09-06 MED ORDER — HYDROMORPHONE HCL 1 MG/ML IJ SOLN: 1.0000 mg | INTRAMUSCULAR | Status: DC | PRN

## 2018-09-06 MED ORDER — SUGAMMADEX SODIUM 200 MG/2ML IV SOLN
INTRAVENOUS | Status: DC | PRN
  Administered 2018-09-06: 200 mg via INTRAVENOUS

## 2018-09-06 MED ORDER — SODIUM CHLORIDE 0.9 % IV SOLN
2.0000 g | INTRAVENOUS | Status: DC
  Administered 2018-09-06: 2 g via INTRAVENOUS
  Filled 2018-09-06: qty 20

## 2018-09-06 MED ORDER — DEXAMETHASONE SODIUM PHOSPHATE 10 MG/ML IJ SOLN
INTRAMUSCULAR | Status: DC | PRN
  Administered 2018-09-06: 5 mg via INTRAVENOUS

## 2018-09-06 MED ORDER — TRAMADOL HCL 50 MG PO TABS
50.0000 mg | ORAL_TABLET | Freq: Four times a day (QID) | ORAL | Status: DC | PRN
  Administered 2018-09-07: 50 mg via ORAL
  Filled 2018-09-06: qty 1

## 2018-09-06 MED ORDER — LACTATED RINGERS IV SOLN: INTRAVENOUS | Status: DC

## 2018-09-06 MED ORDER — SODIUM CHLORIDE 0.9 % IV BOLUS
1000.0000 mL | Freq: Once | INTRAVENOUS | Status: AC
  Administered 2018-09-06: 1000 mL via INTRAVENOUS

## 2018-09-06 MED ORDER — MIDAZOLAM HCL 5 MG/5ML IJ SOLN
INTRAMUSCULAR | Status: DC | PRN
  Administered 2018-09-06: 2 mg via INTRAVENOUS

## 2018-09-06 MED ORDER — FENTANYL CITRATE (PF) 100 MCG/2ML IJ SOLN
INTRAMUSCULAR | Status: AC
  Filled 2018-09-06: qty 2

## 2018-09-06 MED ORDER — MEPERIDINE HCL 50 MG/ML IJ SOLN: 6.2500 mg | INTRAMUSCULAR | Status: DC | PRN

## 2018-09-06 MED ORDER — ACETAMINOPHEN 325 MG PO TABS: 650.0000 mg | ORAL_TABLET | Freq: Four times a day (QID) | ORAL | Status: DC | PRN

## 2018-09-06 MED ORDER — BUPIVACAINE-EPINEPHRINE 0.25% -1:200000 IJ SOLN
INTRAMUSCULAR | Status: DC | PRN
  Administered 2018-09-06: 20 mL

## 2018-09-06 MED ORDER — METOCLOPRAMIDE HCL 5 MG/ML IJ SOLN: 10.0000 mg | Freq: Once | INTRAMUSCULAR | Status: DC | PRN

## 2018-09-06 MED ORDER — FENTANYL CITRATE (PF) 100 MCG/2ML IJ SOLN
INTRAMUSCULAR | Status: DC | PRN
  Administered 2018-09-06: 50 ug via INTRAVENOUS
  Administered 2018-09-06 (×2): 100 ug via INTRAVENOUS

## 2018-09-06 MED ORDER — HYDROCODONE-ACETAMINOPHEN 5-325 MG PO TABS: 1.0000 | ORAL_TABLET | ORAL | Status: DC | PRN

## 2018-09-06 MED ORDER — METRONIDAZOLE IN NACL 5-0.79 MG/ML-% IV SOLN
500.0000 mg | Freq: Three times a day (TID) | INTRAVENOUS | Status: DC
  Administered 2018-09-06 – 2018-09-07 (×3): 500 mg via INTRAVENOUS
  Filled 2018-09-06 (×2): qty 100

## 2018-09-06 MED ORDER — SODIUM CHLORIDE 0.9% FLUSH
3.0000 mL | Freq: Once | INTRAVENOUS | Status: DC
  Filled 2018-09-06: qty 3

## 2018-09-06 MED ORDER — LACTATED RINGERS IR SOLN
Status: DC | PRN
  Administered 2018-09-06: 1000 mL

## 2018-09-06 MED ORDER — ONDANSETRON HCL 4 MG/2ML IJ SOLN
INTRAMUSCULAR | Status: DC | PRN
  Administered 2018-09-06: 4 mg via INTRAVENOUS

## 2018-09-06 MED ORDER — HYDROMORPHONE HCL 1 MG/ML IJ SOLN
1.0000 mg | INTRAMUSCULAR | Status: DC | PRN
  Administered 2018-09-06 – 2018-09-07 (×2): 1 mg via INTRAVENOUS
  Filled 2018-09-06 (×2): qty 1

## 2018-09-06 MED ORDER — LIDOCAINE 2% (20 MG/ML) 5 ML SYRINGE
INTRAMUSCULAR | Status: DC | PRN
  Administered 2018-09-06: 60 mg via INTRAVENOUS

## 2018-09-06 MED ORDER — ROCURONIUM BROMIDE 10 MG/ML (PF) SYRINGE
PREFILLED_SYRINGE | INTRAVENOUS | Status: DC | PRN
  Administered 2018-09-06: 40 mg via INTRAVENOUS
  Administered 2018-09-06: 10 mg via INTRAVENOUS

## 2018-09-06 MED ORDER — 0.9 % SODIUM CHLORIDE (POUR BTL) OPTIME
TOPICAL | Status: DC | PRN
  Administered 2018-09-06: 1000 mL

## 2018-09-06 SURGICAL SUPPLY — 36 items
ADH SKN CLS APL DERMABOND .7 (GAUZE/BANDAGES/DRESSINGS) ×1
APPLIER CLIP ROT 10 11.4 M/L (STAPLE)
APR CLP MED LRG 11.4X10 (STAPLE)
BAG SPEC RTRVL LRG 6X4 10 (ENDOMECHANICALS) ×1
CHLORAPREP W/TINT 26ML (MISCELLANEOUS) ×3 IMPLANT
CLIP APPLIE ROT 10 11.4 M/L (STAPLE) IMPLANT
CLOSURE WOUND 1/2 X4 (GAUZE/BANDAGES/DRESSINGS) ×1
COVER SURGICAL LIGHT HANDLE (MISCELLANEOUS) ×3 IMPLANT
COVER WAND RF STERILE (DRAPES) IMPLANT
CUTTER FLEX LINEAR 45M (STAPLE) ×2 IMPLANT
DECANTER SPIKE VIAL GLASS SM (MISCELLANEOUS) ×3 IMPLANT
DERMABOND ADVANCED (GAUZE/BANDAGES/DRESSINGS) ×2
DERMABOND ADVANCED .7 DNX12 (GAUZE/BANDAGES/DRESSINGS) IMPLANT
DRAPE LAPAROSCOPIC ABDOMINAL (DRAPES) ×3 IMPLANT
ELECT REM PT RETURN 15FT ADLT (MISCELLANEOUS) ×3 IMPLANT
ENDOLOOP SUT PDS II  0 18 (SUTURE)
ENDOLOOP SUT PDS II 0 18 (SUTURE) IMPLANT
GLOVE SURG ORTHO 8.0 STRL STRW (GLOVE) ×3 IMPLANT
GOWN STRL REUS W/TWL XL LVL3 (GOWN DISPOSABLE) ×6 IMPLANT
KIT BASIN OR (CUSTOM PROCEDURE TRAY) ×3 IMPLANT
POUCH SPECIMEN RETRIEVAL 10MM (ENDOMECHANICALS) ×3 IMPLANT
RELOAD 45 VASCULAR/THIN (ENDOMECHANICALS) IMPLANT
RELOAD STAPLE 45 2.5 WHT GRN (ENDOMECHANICALS) IMPLANT
RELOAD STAPLE 45 3.5 BLU ETS (ENDOMECHANICALS) IMPLANT
RELOAD STAPLE TA45 3.5 REG BLU (ENDOMECHANICALS) ×3 IMPLANT
SET IRRIG TUBING LAPAROSCOPIC (IRRIGATION / IRRIGATOR) ×3 IMPLANT
SHEARS HARMONIC ACE PLUS 36CM (ENDOMECHANICALS) ×3 IMPLANT
STRIP CLOSURE SKIN 1/2X4 (GAUZE/BANDAGES/DRESSINGS) ×2 IMPLANT
SUT MNCRL AB 4-0 PS2 18 (SUTURE) ×3 IMPLANT
TOWEL OR 17X26 10 PK STRL BLUE (TOWEL DISPOSABLE) ×3 IMPLANT
TOWEL OR NON WOVEN STRL DISP B (DISPOSABLE) ×3 IMPLANT
TRAY FOLEY MTR SLVR 14FR STAT (SET/KITS/TRAYS/PACK) IMPLANT
TRAY FOLEY MTR SLVR 16FR STAT (SET/KITS/TRAYS/PACK) IMPLANT
TRAY LAPAROSCOPIC (CUSTOM PROCEDURE TRAY) ×3 IMPLANT
TROCAR XCEL BLUNT TIP 100MML (ENDOMECHANICALS) ×3 IMPLANT
TROCAR XCEL NON-BLD 11X100MML (ENDOMECHANICALS) ×3 IMPLANT

## 2018-09-06 NOTE — H&P (Signed)
Cody Wyatt is an 48 y.o. male.    General Surgery [facility]  Chief Complaint: abdominal pain, acute appendicitis  HPI: Patient is a 49 year old male from [place], [place], who presents with acute appendicitis.  Patient was in his normal state of good health until yesterday afternoon when he developed abdominal discomfort.  He did go out and eat a meal in the evening.  Pain became more persistent overnight and localized towards the right lower abdomen.  Patient was seen and evaluated at [facility].  White blood cell count was mildly elevated at 10.9.  Patient had right lower quadrant abdominal tenderness.  CT scan of abdomen and pelvis was obtained which showed evidence of acute appendicitis without perforation.  Patient is referred to [facility] for surgical evaluation and management of acute appendicitis.  Patient states that he has had no prior abdominal surgery.  Past Medical History:  Diagnosis Date  . Anal fissure   . Anxiety   . Hyperhydrosis disorder   . Insomnia   . Low back pain     Past Surgical History:  Procedure Laterality Date  . colonoscopy  05-10-09   per Dr. Deatra Ina, normal     Family History  Problem Relation Age of Onset  . Colon polyps Father   . Leukemia Daughter   . Hypertension Other    Social History:  reports that he has never smoked. He has never used smokeless tobacco. He reports current alcohol use. He reports that he does not use drugs.  Allergies: No Known Allergies  (Not in a hospital admission)   Results for orders placed or performed during the hospital encounter of 09/06/18 (from the past 48 hour(s))  Urinalysis, Routine w reflex microscopic     Status: None   Collection Time: 09/06/18  9:47 AM  Result Value Ref Range   Color, Urine YELLOW YELLOW   APPearance CLEAR CLEAR   Specific Gravity, Urine 1.010 1.005 - 1.030   pH 7.0 5.0 - 8.0   Glucose, UA NEGATIVE NEGATIVE mg/dL   Hgb  urine dipstick NEGATIVE NEGATIVE   Bilirubin Urine NEGATIVE NEGATIVE   Ketones, ur NEGATIVE NEGATIVE mg/dL   Protein, ur NEGATIVE NEGATIVE mg/dL   Nitrite NEGATIVE NEGATIVE   Leukocytes, UA NEGATIVE NEGATIVE    Comment: Microscopic not done on urines with negative protein, blood, leukocytes, nitrite, or glucose < 500 mg/dL. Performed at [facility], [address] [place], [place] [address]   Lipase, blood     Status: None   Collection Time: 09/06/18 10:03 AM  Result Value Ref Range   Lipase 35 11 - 51 U/L    Comment: Performed at [facility], [address] [place], [place] [address]  Comprehensive metabolic panel     Status: Abnormal   Collection Time: 09/06/18 10:03 AM  Result Value Ref Range   Sodium 135 135 - 145 mmol/L   Potassium 3.5 3.5 - 5.1 mmol/L   Chloride 102 98 - 111 mmol/L   CO2 28 22 - 32 mmol/L   Glucose, Bld 105 (H) 70 - 99 mg/dL   BUN 13 6 - 20 mg/dL   Creatinine, Ser 0.84 0.61 - 1.24 mg/dL   Calcium 8.9 8.9 - 10.3 mg/dL   Total Protein 6.6 6.5 - 8.1 g/dL   Albumin 3.9 3.5 - 5.0 g/dL   AST 22 15 - 41 U/L   ALT 24 0 - 44 U/L  Alkaline Phosphatase 38 38 - 126 U/L   Total Bilirubin 0.8 0.3 - 1.2 mg/dL   GFR calc non Af Amer >60 >60 mL/min   GFR calc Af Amer >60 >60 mL/min   Anion gap 5 5 - 15    Comment: Performed at [facility], [address] [place], [place] [address]  CBC     Status: Abnormal   Collection Time: 09/06/18 10:03 AM  Result Value Ref Range   WBC 10.9 (H) 4.0 - 10.5 K/uL   RBC 4.91 4.22 - 5.81 MIL/uL   Hemoglobin 14.9 13.0 - 17.0 g/dL   HCT 44.6 39.0 - 52.0 %   MCV 90.8 80.0 - 100.0 fL   MCH 30.3 26.0 - 34.0 pg   MCHC 33.4 30.0 - 36.0 g/dL   RDW 12.7 11.5 - 15.5 %   Platelets 224 150 - 400 K/uL   nRBC 0.0 0.0 - 0.2 %    Comment: Performed at [facility], [address] [place], [place] [address]   Ct Abdomen Pelvis W Contrast  Result Date: 09/06/2018 CLINICAL DATA:  Right lower  quadrant pain, elevated white count 10.9 EXAM: CT ABDOMEN AND PELVIS WITH CONTRAST TECHNIQUE: Multidetector CT imaging of the abdomen and pelvis was performed using the standard protocol following bolus administration of intravenous contrast. CONTRAST:  160mL ISOVUE-300 IOPAMIDOL (ISOVUE-300) INJECTION 61% COMPARISON:  None. FINDINGS: Lower chest: Minor dependent basilar atelectasis. Normal heart size. No pericardial pleural effusion. Hepatobiliary: No focal liver abnormality is seen. No gallstones, gallbladder wall thickening, or biliary dilatation. Pancreas: Unremarkable. No pancreatic ductal dilatation or surrounding inflammatory changes. Spleen: Normal in size without focal abnormality. Adrenals/Urinary Tract: Adrenal glands are unremarkable. Kidneys are normal, without renal calculi, focal lesion, or hydronephrosis. Bladder is unremarkable. Stomach/Bowel: Negative for bowel obstruction, significant dilatation, ileus, or free air. Appendix is abnormal with mild distension, mucosal enhancement and minor strandy edema. Findings compatible with acute nonruptured appendicitis. Appendix: Location: Retrocecal Diameter: 10 mm Appendicolith: None visualized Mucosal hyper-enhancement: Present Extraluminal gas: None visualized Periappendiceal collection: None visualized Vascular/Lymphatic: No significant vascular findings are present. No enlarged abdominal or pelvic lymph nodes. Reproductive: Prostate is unremarkable. Other: No abdominal wall hernia or abnormality. No abdominopelvic ascites. Musculoskeletal: No acute or significant osseous findings. IMPRESSION: Acute nonruptured appendicitis. Electronically Signed   By: Jerilynn Mages.  Shick M.D.   On: 09/06/2018 11:48    Review of Systems  Constitutional: Negative for chills, diaphoresis and fever.  HENT: Negative.   Eyes: Negative.   Respiratory: Negative.   Cardiovascular: Negative.   Gastrointestinal: Positive for abdominal pain. Negative for nausea and vomiting.   Genitourinary: Negative.   Musculoskeletal: Negative.   Skin: Negative.   Neurological: Negative.   Endo/Heme/Allergies: Negative.   Psychiatric/Behavioral: Negative.     Blood pressure 135/90, pulse (!) 59, temperature 97.9 F (36.6 C), resp. rate 17, height 5\' 9"  (1.753 m), weight 81.6 kg, SpO2 99 %. Physical Exam  Constitutional: He is oriented to person, place, and time. He appears well-developed and well-nourished. No distress.  HENT:  Head: Normocephalic and atraumatic.  Mouth/Throat: No oropharyngeal exudate.  Eyes: Pupils are equal, round, and reactive to light. Conjunctivae are normal. No scleral icterus.  Neck: Normal range of motion. Neck supple. No thyromegaly present.  Cardiovascular: Normal rate, regular rhythm and normal heart sounds.  No murmur heard. Respiratory: Effort normal and breath sounds normal. No respiratory distress. He has no wheezes.  GI: Soft. He exhibits no distension and no mass. There  is abdominal tenderness. There is rebound.  Musculoskeletal: Normal range of motion.        General: No deformity or edema.  Neurological: He is alert and oriented to person, place, and time.  Skin: Skin is warm and dry. He is not diaphoretic.  Psychiatric: He has a normal mood and affect. His behavior is normal.     Assessment/Plan Acute appendicitis  Admit to surgery service  IV abx  Plan lap appendectomy today  Patient is seen and evaluated in the emergency department.  We discussed his diagnostic studies and results.  We discussed laparoscopic appendectomy.  We discussed the possibility of open surgery.  We discussed the hospital stay and postoperative recovery to be anticipated.  Patient understands and wishes to proceed with surgery.  The risks and benefits of the procedure have been discussed at length with the patient.  The patient understands the proposed procedure, potential alternative treatments, and the course of recovery to be expected.  All of the  patient's questions have been answered at this time.  The patient wishes to proceed with surgery.  Armandina Gemma, [facility]: [place], MD 09/06/2018, 2:16 PM

## 2018-09-06 NOTE — ED Triage Notes (Signed)
RLQ abd pain since yesterday. Denies N/V/D

## 2018-09-06 NOTE — ED Notes (Signed)
Reports been called to [facility] charge nurse. [facility] at bedside ready to transfer.

## 2018-09-06 NOTE — ED Provider Notes (Signed)
Transferred from [place] for confirmed appendicitis on CT abd/pelvis.  PLAN: Surgery consulted.  Discussed patient with Dr. Harlow Asa (Surgery) who evaluated patient in the ED.  Patient will go to the OR.  1. Acute appendicitis, unspecified acute appendicitis type      Volanda Napoleon, PA-C 09/06/18 1429    Duffy Bruce, MD 09/08/18 [phone]

## 2018-09-06 NOTE — Op Note (Signed)
OPERATIVE REPORT - LAPAROSCOPIC APPENDECTOMY  Preop diagnosis:  Acute appendicitis  Postop diagnosis:  same  Procedure:  Laparoscopic appendectomy  Surgeon:  Armandina Gemma, MD  Anesthesia:  general endotracheal  Estimated blood loss:  minimal  Preparation:  Chlora-prep  Complications:  none  Indications:  Patient is a 48 year old male from [place], [place], who presents with acute appendicitis.  Patient was in his normal state of good health until yesterday afternoon when he developed abdominal discomfort.  He did go out and eat a meal in the evening.  Pain became more persistent overnight and localized towards the right lower abdomen.  Patient was seen and evaluated at [facility]  White blood cell count was mildly elevated at 10.9.  Patient had right lower quadrant abdominal tenderness.  CT scan of abdomen and pelvis was obtained which showed evidence of acute appendicitis without perforation.  Patient is referred to [facility] for surgical evaluation and management of acute appendicitis.  Procedure:  Patient is brought to the operating room and placed in a supine position on the operating room table. Following administration of general anesthesia, a time out was held and the patient's name and procedure is confirmed. Patient is then prepped and draped in the usual strict aseptic fashion.  After ascertaining that an adequate level of anesthesia has been achieved, a peri-umbilical incision is made with a #15 blade. Dissection is carried down to the fascia. Fascia is incised in the midline and the peritoneal cavity is entered cautiously. A #0-vicryl pursestring suture is placed in the fascia. An Hassan cannula is introduced under direct vision and secured with the pursestring suture. The abdomen is insufflated with carbon dioxide. The laparoscope is introduced and the abdomen is explored. Operative ports are placed in the right upper quadrant and left lower  quadrant. The appendix is identified. The mesoappendix is divided with the harmonic scalpel. Dissection is carried down to the base of the appendix. The base of the appendix is dissected out clearing the junction with the cecal wall. Using an Endo-GIA stapler, the base of the appendix is transected at the junction with the cecal wall. There is good approximation of tissue along the staple line. There is good hemostasis along the staple line. The appendix is placed into an endo-catch bag and withdrawn through the umbilical port. The #0-vicryl pursestring suture is tied securely.  Right lower quadrant is irrigated with warm saline which is evacuated. Good hemostasis is noted. Ports are removed under direct vision. Good hemostasis is noted at the port sites. Pneumoperitoneum is released.  Skin incisions are anesthetized with local anesthetic. Wounds are closed with interrupted 4-0 Monocryl subcuticular sutures. Wounds are washed and dried and Dermabond was applied. The patient is awakened from anesthesia and brought to the recovery room. The patient tolerated the procedure well.  Armandina Gemma, MD [facility] Office: [phone]

## 2018-09-06 NOTE — Anesthesia Postprocedure Evaluation (Signed)
Anesthesia Post Note  Patient: Cody Wyatt  Procedure(s) Performed: APPENDECTOMY LAPAROSCOPIC (N/A Abdomen)     Patient location during evaluation: PACU Anesthesia Type: General Level of consciousness: awake and alert Pain management: pain level controlled Vital Signs Assessment: post-procedure vital signs reviewed and stable Respiratory status: spontaneous breathing, nonlabored ventilation, respiratory function stable and patient connected to nasal cannula oxygen Cardiovascular status: blood pressure returned to baseline and stable Postop Assessment: no apparent nausea or vomiting Anesthetic complications: no    Last Vitals:  Vitals:   09/06/18 1400 09/06/18 1430  BP: (!) 143/97 (!) 128/98  Pulse: 73 (!) 59  Resp:  17  Temp:    SpO2: 99% 100%    Last Pain:  Vitals:   09/06/18 0946  TempSrc: Oral  PainSc:                  Montez Hageman

## 2018-09-06 NOTE — Anesthesia Procedure Notes (Signed)
Procedure Name: Intubation Performed by: Gean Maidens, CRNA Pre-anesthesia Checklist: Patient identified, Emergency Drugs available, Suction available, Patient being monitored and Timeout performed Patient Re-evaluated:Patient Re-evaluated prior to induction Oxygen Delivery Method: Circle system utilized Preoxygenation: Pre-oxygenation with 100% oxygen Induction Type: IV induction Ventilation: Mask ventilation without difficulty Laryngoscope Size: Mac and 4 Grade View: Grade I Tube type: Oral Tube size: 7.5 mm Number of attempts: 1 Airway Equipment and Method: Stylet Placement Confirmation: ETT inserted through vocal cords under direct vision,  positive ETCO2 and breath sounds checked- equal and bilateral Secured at: 23 cm Tube secured with: Tape Dental Injury: Teeth and Oropharynx as per pre-operative assessment

## 2018-09-06 NOTE — ED Provider Notes (Addendum)
[facility] EMERGENCY DEPARTMENT Provider Note   CSN: [id number] Arrival date & time: 09/06/18  8563     History   Chief Complaint Chief Complaint  Patient presents with  . Abdominal Pain    HPI Cody Wyatt is a 48 y.o. male with a past medical history significant for GERD, anxiety who presents today complaining for RLQ pain. Patient reports pain started yesterday afternoon and he describes as sharp pain localized to the right side. Patient reports that pain started on his left side and move to the right with some periumbilical tenderness. Patient initially rates the pain at 8/10. He took Pepto bismol for the pain. He reports this morning pain is 5/10. He denies any nausea, vomiting, diarrhea, chest pain or shortness of breath. Patient has otherwise been at his baseline.   HPI  Past Medical History:  Diagnosis Date  . Anal fissure   . Anxiety   . Hyperhydrosis disorder   . Insomnia   . Low back pain     Patient Active Problem List   Diagnosis Date Noted  . 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 FINGER SPRAIN 12/16/2009  . ACUTE MAXILLARY SINUSITIS 11/24/2009  . ACUTE BRONCHITIS 11/24/2009  . TRANSAMINASES, SERUM, ELEVATED 11/11/2009  . HEMORRHAGE OF RECTUM AND ANUS 05/09/2009  . DEPRESSION 06/23/2008  . LOW BACK PAIN 06/23/2008  . ANXIETY 02/19/2008  . LYMPHADENOPATHY, AXILLA 11/18/2007  . PRURITUS ANI 11/03/2007  . HYPERHIDROSIS 11/03/2007  . BACK PAIN, THORACIC REGION 09/25/2007  . RECTAL FISSURE 07/17/2007  . HEADACHE 04/29/2007  . CHICKENPOX, HX OF 03/19/2007    Past Surgical History:  Procedure Laterality Date  . colonoscopy  05-10-09   per Dr. Deatra Ina, normal         Home Medications    Prior to Admission medications   Medication Sig Start Date End Date Taking? Authorizing Provider  clonazePAM (KLONOPIN) 1 MG tablet TAKE 1 TABLET BY MOUTH TWICE A DAY AS NEEDED 01/21/18  Yes Laurey Morale, MD  hydrocortisone-pramoxine  [facility]) 2.5-1 % rectal cream INSERT 1 APPLICATORFUL RECTALLY 3 TIMES A DAY 08/11/18  Yes Laurey Morale, MD  metaxalone (SKELAXIN) 800 MG tablet Take 1 tablet (800 mg total) by mouth 3 (three) times daily as needed. 12/12/16  Yes Laurey Morale, MD  omeprazole (PRILOSEC) 40 MG capsule TAKE 1 CAPSULE BY MOUTH EVERY DAY 01/10/18  Yes Laurey Morale, MD  sildenafil (VIAGRA) 100 MG tablet Take 1 tablet (100 mg total) by mouth daily as needed for erectile dysfunction. 08/11/18  Yes Laurey Morale, MD  temazepam (RESTORIL) 15 MG capsule TAKE 2 CAPSULES BY MOUTH AT BEDTIME AS NEEDED 06/11/18  Yes Laurey Morale, MD    Family History Family History  Problem Relation Age of Onset  . Colon polyps Father   . Leukemia Daughter   . Hypertension Other     Social History Social History   Tobacco Use  . Smoking status: Never Smoker  . Smokeless tobacco: Never Used  Substance Use Topics  . Alcohol use: Yes    Alcohol/week: 0.0 standard drinks    Comment: occ  . Drug use: No     Allergies   Patient has no known allergies.   Review of Systems Review of Systems   Physical Exam Updated Vital Signs BP 130/87 (BP Location: Left Arm)   Pulse (!) 58   Temp 97.9 F (36.6 C) (Oral)   Resp 16   Ht 5\' 9"  (1.753 m)  Wt 81.6 kg   SpO2 100%   BMI 26.58 kg/m   Physical Exam Vitals signs and nursing note reviewed.  Constitutional:      Appearance: He is well-developed.  HENT:     Head: Normocephalic.     Mouth/Throat:     Mouth: Mucous membranes are moist.  Eyes:     Extraocular Movements: Extraocular movements intact.     Pupils: Pupils are equal, round, and reactive to light.  Cardiovascular:     Rate and Rhythm: Normal rate and regular rhythm.  Abdominal:     General: Abdomen is flat. Bowel sounds are normal.     Palpations: Abdomen is soft.     Comments: + RLQ tenderness   Skin:    General: Skin is warm.     Capillary Refill: Capillary refill takes less than 2 seconds.    Neurological:     General: No focal deficit present.     Mental Status: He is alert and oriented to person, place, and time.  Psychiatric:        Mood and Affect: Mood normal.        Behavior: Behavior normal.      ED Treatments / Results  Labs (all labs ordered are listed, but only abnormal results are displayed) Labs Reviewed  LIPASE, BLOOD  COMPREHENSIVE METABOLIC PANEL  CBC  URINALYSIS, ROUTINE W REFLEX MICROSCOPIC    EKG None  Radiology No results found.  Procedures Procedures (including critical care time)  Medications Ordered in ED Medications  sodium chloride flush (NS) 0.9 % injection 3 mL (has no administration in time range)     Initial Impression / Assessment and Plan / ED Course  I have reviewed the triage vital signs and the nursing notes.  Pertinent labs & imaging results that were available during my care of the patient were reviewed by me and considered in my medical decision making (see chart for details).   Patient is 48 yo male who presents with RLQ pain for the past day. Patient reports that pain started on the left side and move t the right. Pain is sharp in nature but has happened in the past with quick resolution. On exam he is tender to palpation in the RLQ with some guarding and mild rebound tenderness. WBC 10.9 with normal kidney and liver function. Based on history and presenting symptoms concern for appendicitis. However, given recurrence of pain would also consider IBD (crohn's, ..) Low suspicion for pancreatitis, mass, cholelithiasis or other infectious process. CT abdomen pelvis showed mild distention of the p[endix, mucosal enhancement and minor strandy edema consistent with acute non ruptured appendicitis. Surgery was consulted and patient will be transfer to [facility] for appendectomy this afternoon. Patient was informed and in agreement with plan. He was made NPO in anticipation for surgery and was stable for  transfer.  Attestation: I saw and evaluated the patient, reviewed the resident's note and I agree with the findings and plan.  EKG: None  Cody Wyatt is a 48 y.o. male who presented with right lower quadrant pain.  Started with periumbilical pain yesterday now radiates to the right lower quadrant.  His sister recently had appendicitis as well.  Patient has right lower quadrant tenderness on exam.  White blood cell count slightly elevated at 11.  CT showed acute appendicitis with no rupture.  I talked to Dr. Tera Helper from surgery at [facility]. He recommend transfer to the ED and he will see patient. Dr. Ellender Hose will  be accepting doctor.   Final Clinical Impressions(s) / ED Diagnoses   Final diagnoses:  None    ED Discharge Orders    None       Marjie Skiff, MD 09/06/18 1226    Drenda Freeze, MD 09/06/18 1501    Drenda Freeze, MD 10/03/18 1010    Drenda Freeze, MD 10/03/18 1012

## 2018-09-06 NOTE — Anesthesia Preprocedure Evaluation (Signed)
Anesthesia Evaluation  Patient identified by MRN, date of birth, ID band Patient awake    Reviewed: Allergy & Precautions, NPO status , Patient's Chart, lab work & pertinent test results  Airway Mallampati: II  TM Distance: >3 FB Neck ROM: Full    Dental no notable dental hx.    Pulmonary neg pulmonary ROS,    Pulmonary exam normal breath sounds clear to auscultation       Cardiovascular negative cardio ROS Normal cardiovascular exam Rhythm:Regular Rate:Normal     Neuro/Psych negative neurological ROS  negative psych ROS   GI/Hepatic negative GI ROS, Neg liver ROS,   Endo/Other  negative endocrine ROS  Renal/GU negative Renal ROS  negative genitourinary   Musculoskeletal negative musculoskeletal ROS (+)   Abdominal   Peds negative pediatric ROS (+)  Hematology negative hematology ROS (+)   Anesthesia Other Findings   Reproductive/Obstetrics negative OB ROS                             Anesthesia Physical Anesthesia Plan  ASA: II and emergent  Anesthesia Plan: General   Post-op Pain Management:    Induction: Intravenous  PONV Risk Score and Plan: 3 and Ondansetron, Dexamethasone, Midazolam and Treatment may vary due to age or medical condition  Airway Management Planned: Oral ETT  Additional Equipment:   Intra-op Plan:   Post-operative Plan: Extubation in OR  Informed Consent: I have reviewed the patients History and Physical, chart, labs and discussed the procedure including the risks, benefits and alternatives for the proposed anesthesia with the patient or authorized representative who has indicated his/her understanding and acceptance.     Dental advisory given  Plan Discussed with: CRNA  Anesthesia Plan Comments:         Anesthesia Quick Evaluation

## 2018-09-06 NOTE — Transfer of Care (Signed)
Immediate Anesthesia Transfer of Care Note  Patient: Cody Wyatt  Procedure(s) Performed: APPENDECTOMY LAPAROSCOPIC (N/A Abdomen)  Patient Location: PACU  Anesthesia Type:General  Level of Consciousness: awake, alert  and oriented  Airway & Oxygen Therapy: Patient Spontanous Breathing and Patient connected to face mask oxygen  Post-op Assessment: Report given to RN and Post -op Vital signs reviewed and stable  Post vital signs: Reviewed and stable  Last Vitals:  Vitals Value Taken Time  BP    Temp    Pulse 89 09/06/2018  4:39 PM  Resp 16 09/06/2018  4:39 PM  SpO2 100 % 09/06/2018  4:39 PM  Vitals shown include unvalidated device data.  Last Pain:  Vitals:   09/06/18 0946  TempSrc: Oral  PainSc:          Complications: No apparent anesthesia complications

## 2018-09-06 NOTE — ED Notes (Signed)
Surgeon at bedside.  

## 2018-09-07 ENCOUNTER — Encounter (HOSPITAL_COMMUNITY): Payer: Self-pay | Admitting: Surgery

## 2018-09-07 DIAGNOSIS — K3580 Unspecified acute appendicitis: Secondary | ICD-10-CM | POA: Diagnosis not present

## 2018-09-07 LAB — HIV ANTIBODY (ROUTINE TESTING W REFLEX): HIV Screen 4th Generation wRfx: NONREACTIVE

## 2018-09-07 MED ORDER — TRAMADOL HCL 50 MG PO TABS: 50.0000 mg | ORAL_TABLET | Freq: Four times a day (QID) | ORAL | 0 refills | Status: DC | PRN

## 2018-09-07 NOTE — Progress Notes (Signed)
Assessment unchanged. Pt and wife verbalized understanding of dc instructions through teach back. No scripts at dc. Discharged via foot per request to front entrance accompanied by wife and NT.

## 2018-09-07 NOTE — Discharge Summary (Signed)
Physician Discharge Summary [facility]  Patient ID: Cody Wyatt MRN: [id number] DOB/AGE: 1971-07-05 48 y.o.  Admit date: 09/06/2018 Discharge date: 09/07/2018  Admission Diagnoses:  Acute appendicitis  Discharge Diagnoses:  Principal Problem:   Appendicitis, acute Active Problems:   Acute appendicitis   Discharged Condition: good  Hospital Course: Patient was admitted for observation following laparoscopic appendectomy.  Post op course was uncomplicated.  Pain was well controlled.  Tolerated diet.  Patient was prepared for discharge home on POD#1.  Consults: None  Treatments: surgery: lap appendectomy  Discharge Exam: Blood pressure 103/69, pulse 65, temperature (!) 97.5 F (36.4 C), temperature source Oral, resp. rate 15, height 5\' 9"  (1.753 m), weight 81.6 kg, SpO2 98 %. HEENT - clear Neck - soft Chest - clear bilaterally Cor - RRR Abd - wounds dry and intact with Dermabond  Disposition: Home  Discharge Instructions    Diet - low sodium heart healthy   Complete by:  As directed    Discharge instructions   Complete by:  As directed    [facility]  LAPAROSCOPIC SURGERY:  POST-OP INSTRUCTIONS  Always review your discharge instruction sheet given to you by the facility where your surgery was performed.  A prescription for pain medication may be given to you upon discharge.  Take your pain medication as prescribed.  If narcotic pain medicine is not needed, then you may take acetaminophen (Tylenol) or ibuprofen (Advil) as needed.  Take your usually prescribed medications unless otherwise directed.  If you need a refill on your pain medication, please contact your pharmacy.  They will contact our office to request authorization. Prescriptions will not be filled after 5 P.M. or on weekends.  You should follow a light diet the first few days after arrival home, such as soup and crackers or toast.  Be sure to include plenty  of fluids daily.  Most patients will experience some swelling and bruising in the area of the incisions.  Ice packs will help.  Swelling and bruising can take several days to resolve.   It is common to experience some constipation after surgery.  Increasing fluid intake and taking a stool softener (such as Colace) will usually help or prevent this problem from occurring.  A mild laxative (Milk of Magnesia or Miralax) should be taken according to package instructions if there has been no bowel movement after 48 hours.  You will have steri-strips and a gauze dressing over your incisions.  You may remove the gauze bandage on the second day after surgery, and you may shower at that time.  Leave your steri-strips (small skin tapes) in place directly over the incision.  These strips should remain on the skin for 5-7 days and then be removed.  You may get them wet in the shower and pat them dry.  Any sutures or staples will be removed at the office during your follow-up visit.  ACTIVITIES:  You may resume regular (light) daily activities beginning the next day - such as daily self-care, walking, climbing stairs - gradually increasing activities as tolerated.  You may have sexual intercourse when it is comfortable.  Refrain from any heavy lifting or straining until approved by your doctor.  You may drive when you are no longer taking prescription pain medication, you can comfortably wear a seatbelt, and you can safely maneuver your car and apply brakes.  You should see your doctor in the office for a follow-up appointment approximately 2-3 weeks  after your surgery.  Make sure that you call for this appointment within a day or two after you arrive home to insure a convenient appointment time.  WHEN TO CALL YOUR DOCTOR: Fever over 101.0 Inability to urinate Continued bleeding from incision Increased pain, redness, or drainage from the incision Increasing abdominal pain  The clinic staff is available to  answer your questions during regular business hours.  Please don't hesitate to call and ask to speak to one of the nurses for clinical concerns.  If you have a medical emergency, go to the nearest emergency room or call 911.  A surgeon from [facility] is always on call for the hospital.  Earnstine Regal, MD, [facility] Office: [place] Free:  [place] [phone]  Website: [email or web address]   Increase activity slowly   Complete by:  As directed    No dressing needed   Complete by:  As directed      Allergies as of 09/07/2018   No Known Allergies     Medication List    TAKE these medications   clonazePAM 1 MG tablet Commonly known as:  KLONOPIN TAKE 1 TABLET BY MOUTH TWICE A DAY AS NEEDED What changed:    how much to take  how to take this  when to take this  reasons to take this   hydrocortisone-pramoxine 2.5-1 % rectal cream Commonly known as:  ANALPRAM-HC INSERT 1 APPLICATORFUL RECTALLY 3 TIMES A DAY What changed:    how much to take  how to take this  when to take this  reasons to take this   metaxalone 800 MG tablet Commonly known as:  SKELAXIN Take 1 tablet (800 mg total) by mouth 3 (three) times daily as needed. What changed:  reasons to take this   multivitamin tablet Take 1 tablet by mouth daily.   omeprazole 40 MG capsule Commonly known as:  PRILOSEC TAKE 1 CAPSULE BY MOUTH EVERY DAY   sildenafil 100 MG tablet Commonly known as:  VIAGRA Take 1 tablet (100 mg total) by mouth daily as needed for erectile dysfunction.   temazepam 15 MG capsule Commonly known as:  RESTORIL TAKE 2 CAPSULES BY MOUTH AT BEDTIME AS NEEDED What changed:  See the new instructions.   traMADol 50 MG tablet Commonly known as:  ULTRAM Take 1-2 tablets (50-100 mg total) by mouth every 6 (six) hours as needed.      Follow-up Information    Armandina Gemma, MD. Schedule an appointment as soon as  possible for a visit in 3 week(s).   Specialty:  General Surgery Contact information: [address] [place] [address] [phone]           Earnstine Regal, MD, [facility] Office: [phone]   Signed: Armandina Gemma 09/07/2018, 8:38 AM

## 2018-09-08 ENCOUNTER — Encounter: Payer: Self-pay | Admitting: *Deleted

## 2018-09-16 DIAGNOSIS — D225 Melanocytic nevi of trunk: Secondary | ICD-10-CM | POA: Diagnosis not present

## 2018-09-16 DIAGNOSIS — L821 Other seborrheic keratosis: Secondary | ICD-10-CM | POA: Diagnosis not present

## 2018-09-16 DIAGNOSIS — D1801 Hemangioma of skin and subcutaneous tissue: Secondary | ICD-10-CM | POA: Diagnosis not present

## 2018-11-03 ENCOUNTER — Ambulatory Visit: Payer: Commercial Managed Care - PPO | Admitting: Family Medicine

## 2018-11-03 ENCOUNTER — Encounter: Payer: Self-pay | Admitting: Family Medicine

## 2018-11-03 ENCOUNTER — Other Ambulatory Visit: Payer: Self-pay

## 2018-11-03 VITALS — BP 110/72 | HR 61 | Temp 97.8°F | Ht 69.0 in | Wt 180.2 lb

## 2018-11-03 DIAGNOSIS — R740 Nonspecific elevation of levels of transaminase and lactic acid dehydrogenase [LDH]: Secondary | ICD-10-CM

## 2018-11-03 DIAGNOSIS — R7401 Elevation of levels of liver transaminase levels: Secondary | ICD-10-CM

## 2018-11-03 LAB — HEPATIC FUNCTION PANEL
ALT: 60 U/L — ABNORMAL HIGH (ref 0–53)
AST: 47 U/L — ABNORMAL HIGH (ref 0–37)
Albumin: 4.4 g/dL (ref 3.5–5.2)
Alkaline Phosphatase: 35 U/L — ABNORMAL LOW (ref 39–117)
Bilirubin, Direct: 0.1 mg/dL (ref 0.0–0.3)
Total Bilirubin: 0.8 mg/dL (ref 0.2–1.2)
Total Protein: 7 g/dL (ref 6.0–8.3)

## 2018-11-03 LAB — BASIC METABOLIC PANEL
BUN: 13 mg/dL (ref 6–23)
CO2: 29 meq/L (ref 19–32)
Calcium: 10.1 mg/dL (ref 8.4–10.5)
Chloride: 101 mEq/L (ref 96–112)
Creatinine, Ser: 0.94 mg/dL (ref 0.40–1.50)
GFR: 85.19 mL/min (ref >60.00)
Glucose, Bld: 102 mg/dL — ABNORMAL HIGH (ref 70–99)
Potassium: 4.7 mEq/L (ref 3.5–5.1)
Sodium: 138 mEq/L (ref 135–145)

## 2018-11-03 LAB — POC URINALSYSI DIPSTICK (AUTOMATED)
Bilirubin, UA: NEGATIVE
Blood, UA: NEGATIVE
Glucose, UA: NEGATIVE
Ketones, UA: NEGATIVE
Leukocytes, UA: NEGATIVE
Nitrite, UA: NEGATIVE
Protein, UA: NEGATIVE
SPEC GRAV UA: 1.01 (ref 1.010–1.025)
Urobilinogen, UA: 0.2 E.U./dL
pH, UA: 7.5 (ref 5.0–8.0)

## 2018-11-03 LAB — TSH: TSH: 1.49 u[IU]/mL (ref 0.35–4.50)

## 2018-11-03 LAB — C-REACTIVE PROTEIN: CRP: <1 mg/dL (ref 0.5–20.0)

## 2018-11-03 LAB — SEDIMENTATION RATE: Sed Rate: 4 mm/hr (ref 0–15)

## 2018-11-03 NOTE — Progress Notes (Signed)
   Subjective:    Patient ID: Cody Wyatt, male    DOB: Oct 04, 1970, 48 y.o.   MRN: [id number]  HPI Here with concern about recent test results. He had a work place screening on 10-29-18 and these returned with an ALT of 113 (from 24 on 09-06-18), an AST of 315 (up from 22), and a CRP of 4.82. He feels fine except for some occasional epigastric pain and heartburn. He takes Omeprazole daily. No fevers. No change in bowel habits. He droinks 3-4 beers a day but no other alcohol. He does not take vitamins or supplements. He had an appendectomy in January and he recovered well from that.   Review of Systems  Constitutional: Negative.   Respiratory: Negative.   Cardiovascular: Negative.   Gastrointestinal: Negative.   Genitourinary: Negative.        Objective:   Physical Exam Constitutional:      Appearance: Normal appearance.  Eyes:     General: No scleral icterus. Cardiovascular:     Rate and Rhythm: Normal rate and regular rhythm.     Pulses: Normal pulses.     Heart sounds: Normal heart sounds.  Pulmonary:     Effort: Pulmonary effort is normal.     Breath sounds: Normal breath sounds.  Abdominal:     General: Abdomen is flat. Bowel sounds are normal. There is no distension.     Palpations: Abdomen is soft. There is no mass.     Tenderness: There is no abdominal tenderness. There is no guarding or rebound.     Hernia: No hernia is present.     Comments: No HSM   Lymphadenopathy:     Cervical: No cervical adenopathy.  Neurological:     Mental Status: He is alert.           Assessment & Plan:  Elevated transaminases of uncertain etiology. We will test him for hepatitis, etc and will set up an abdominal US.  Alysia Penna, MD

## 2018-11-05 LAB — EPSTEIN-BARR VIRUS VCA, IGG: EBV VCA IgG: <18 U/mL

## 2018-11-05 LAB — HEPATITIS A ANTIBODY, TOTAL: Hepatitis A AB,Total: NONREACTIVE

## 2018-11-05 LAB — HIV ANTIBODY (ROUTINE TESTING W REFLEX): HIV 1&2 Ab, 4th Generation: NONREACTIVE

## 2018-11-05 LAB — EPSTEIN-BARR VIRUS VCA, IGM

## 2018-11-05 LAB — HEPATITIS C ANTIBODY
Hepatitis C Ab: NONREACTIVE
SIGNAL TO CUT-OFF: 0.01 (ref <1.00)

## 2018-11-05 LAB — CYTOMEGALOVIRUS ANTIBODY, IGG: Cytomegalovirus Ab-IgG: 2.2 U/mL — ABNORMAL HIGH

## 2018-11-05 LAB — HEPATITIS B SURFACE ANTIBODY,QUALITATIVE: Hep B S Ab: NONREACTIVE

## 2018-11-05 LAB — HEPATITIS B SURFACE ANTIGEN: Hepatitis B Surface Ag: NONREACTIVE

## 2018-11-05 LAB — CMV IGM: CMV IgM: <30 AU/mL

## 2018-11-06 ENCOUNTER — Encounter: Payer: Self-pay | Admitting: *Deleted

## 2018-11-10 ENCOUNTER — Other Ambulatory Visit: Payer: Commercial Managed Care - PPO

## 2018-11-10 ENCOUNTER — Ambulatory Visit
Admission: RE | Admit: 2018-11-10 | Discharge: 2018-11-10 | Disposition: A | Discharge status: Home / Self Care | Payer: Commercial Managed Care - PPO | Source: Ambulatory Visit | Attending: Family Medicine | Admitting: Family Medicine

## 2018-11-10 ENCOUNTER — Encounter: Payer: Self-pay | Admitting: *Deleted

## 2018-11-10 ENCOUNTER — Other Ambulatory Visit: Payer: Self-pay

## 2018-11-10 DIAGNOSIS — R740 Nonspecific elevation of levels of transaminase and lactic acid dehydrogenase [LDH]: Secondary | ICD-10-CM | POA: Diagnosis not present

## 2018-11-10 DIAGNOSIS — R7401 Elevation of levels of liver transaminase levels: Secondary | ICD-10-CM

## 2019-02-09 ENCOUNTER — Telehealth: Payer: Self-pay

## 2019-02-09 DIAGNOSIS — K219 Gastro-esophageal reflux disease without esophagitis: Secondary | ICD-10-CM

## 2019-02-09 NOTE — Telephone Encounter (Signed)
I referred him to GI

## 2019-02-09 NOTE — Telephone Encounter (Signed)
Copied from [place] [id number]. Topic: Referral - Request for Referral >> Feb 09, 2019  1:56 PM Marin Olp L wrote: Has patient seen PCP for this complaint? yes  *If NO, is insurance requiring patient see PCP for this issue before PCP can refer them? Referral for which specialty: upper GI Preferred provider/office: Dr. Sharlene Motts recommendation Reason for referral: sour burning stomach  Please call once referral is placed.

## 2019-02-09 NOTE — Telephone Encounter (Signed)
Dr. Fry please advise on referral.   Thanks  

## 2019-03-10 ENCOUNTER — Ambulatory Visit (INDEPENDENT_AMBULATORY_CARE_PROVIDER_SITE_OTHER): Payer: Commercial Managed Care - PPO | Admitting: Gastroenterology

## 2019-03-10 ENCOUNTER — Other Ambulatory Visit: Payer: Self-pay | Admitting: Family Medicine

## 2019-03-10 ENCOUNTER — Encounter: Payer: Self-pay | Admitting: Gastroenterology

## 2019-03-10 VITALS — Ht 69.0 in | Wt 175.0 lb

## 2019-03-10 DIAGNOSIS — R1013 Epigastric pain: Secondary | ICD-10-CM

## 2019-03-10 DIAGNOSIS — R740 Nonspecific elevation of levels of transaminase and lactic acid dehydrogenase [LDH]: Secondary | ICD-10-CM

## 2019-03-10 DIAGNOSIS — G8929 Other chronic pain: Secondary | ICD-10-CM

## 2019-03-10 DIAGNOSIS — Z1211 Encounter for screening for malignant neoplasm of colon: Secondary | ICD-10-CM | POA: Diagnosis not present

## 2019-03-10 DIAGNOSIS — R7401 Elevation of levels of liver transaminase levels: Secondary | ICD-10-CM

## 2019-03-10 NOTE — Progress Notes (Signed)
This patient contacted our office requesting a physician telemedicine consultation regarding clinical questions and/or test results. Due to COVID restrictions, this was felt to be the most appropriate method of patient evaluation. If new patient, they were referred by PCP noted below  Participants on the conference : myself and patient   The patient consented to this consultation and was aware that a charge will be placed through their insurance.  They were also made aware of the limitations of telemedicine.  I was in my office and the patient was at home.    _____________________________________________________________________________________________              Cody Wyatt Gastroenterology Consult Note:  History: Cody Wyatt 03/10/2019  Referring provider: Laurey Morale, MD  Reason for consult/chief complaint: Upper abdominal pain  Subjective  HPI: Colonoscopy by Dr. Deatra Ina September 2010, done for rectal bleeding.  Normal exam, presumably benign anal bleeding.  From Dr. Barbie Banner note of 11/03/2018: "Here with concern about recent test results. He had a work place screening on 10-29-18 and these returned with an ALT of 113 (from 24 on 09-06-18), an AST of 315 (up from 22), and a CRP of 4.82. He feels fine except for some occasional epigastric pain and heartburn. He takes Omeprazole daily. No fevers. No change in bowel habits. He droinks 3-4 beers a day but no other alcohol. He does not take vitamins or supplements. He had an appendectomy in January and he recovered well from that."   Cody Wyatt reports at least a year of intermittent epigastric to periumbilic, nonradiating, burning abdominal discomfort that is sometimes triggered by meals, often has associated bloating.  He denies nausea, vomiting, dysphagia, odynophagia, early satiety or weight loss.  He denies change in bowel habits or rectal bleeding.  Regarding the elevated transaminases noted above, he recalls this having  occurred in the past as well.  It apparently improved on that occasion as well, though without making any particular changes to his lifestyle.  He thought it might be due to Lexapro that he had taken for some time but has since discontinued.  ROS:  Review of Systems  Constitutional: Negative for appetite change and unexpected weight change.  HENT: Negative for mouth sores and voice change.   Eyes: Negative for pain and redness.  Respiratory: Negative for cough and shortness of breath.   Cardiovascular: Negative for chest pain and palpitations.  Genitourinary: Negative for dysuria and hematuria.  Musculoskeletal: Negative for arthralgias and myalgias.  Skin: Negative for pallor and rash.  Neurological: Negative for weakness and headaches.  Hematological: Negative for adenopathy.     Past Medical History: Past Medical History:  Diagnosis Date  . Anal fissure   . Anxiety   . Hyperhydrosis disorder   . Insomnia   . Low back pain      Past Surgical History: Past Surgical History:  Procedure Laterality Date  . colonoscopy  05-10-09   per Dr. Deatra Ina, normal   . LAPAROSCOPIC APPENDECTOMY N/A 09/06/2018   Procedure: APPENDECTOMY LAPAROSCOPIC;  Surgeon: Armandina Gemma, MD;  Location: [facility];  Service: General;  Laterality: N/A;     Family History: Family History  Problem Relation Age of Onset  . Colon polyps Father   . Leukemia Daughter   . AAA (abdominal aortic aneurysm) Neg Hx     Social History: Social History   Socioeconomic History  . Marital status: Married    Spouse name: Not on file  . Number of children: Not on file  .  Years of education: Not on file  . Highest education level: Not on file  Occupational History  . Not on file  Social Needs  . Financial resource strain: Not on file  . Food insecurity    Worry: Not on file    Inability: Not on file  . Transportation needs    Medical: Not on file    Non-medical: Not on file  Tobacco Use  . Smoking status:  Never Smoker  . Smokeless tobacco: Never Used  Substance and Sexual Activity  . Alcohol use: Yes    Alcohol/week: 0.0 standard drinks    Comment: occ  . Drug use: No  . Sexual activity: Not on file  Lifestyle  . Physical activity    Days per week: Not on file    Minutes per session: Not on file  . Stress: Not on file  Relationships  . Social Herbalist on phone: Not on file    Gets together: Not on file    Attends religious service: Not on file    Active member of club or organization: Not on file    Attends meetings of clubs or organizations: Not on file    Relationship status: Not on file  Other Topics Concern  . Not on file  Social History Narrative  . Not on file    Allergies: No Known Allergies  Outpatient Meds: Current Outpatient Medications  Medication Sig Dispense Refill  . clonazePAM (KLONOPIN) 1 MG tablet TAKE 1 TABLET BY MOUTH TWICE A DAY AS NEEDED (Patient taking differently: Take 1 mg by mouth daily as needed for anxiety. TAKE 1 TABLET BY MOUTH TWICE A DAY AS NEEDED) 60 tablet 5  . hydrocortisone-pramoxine (ANALPRAM-HC) 2.5-1 % rectal cream INSERT 1 APPLICATORFUL RECTALLY 3 TIMES A DAY (Patient taking differently: Place 1 application rectally daily as needed for hemorrhoids. INSERT 1 APPLICATORFUL RECTALLY 3 TIMES A DAY) 30 g 5  . metaxalone (SKELAXIN) 800 MG tablet Take 1 tablet (800 mg total) by mouth 3 (three) times daily as needed. (Patient taking differently: Take 800 mg by mouth 3 (three) times daily as needed for muscle spasms. ) 270 tablet 3  . omeprazole (PRILOSEC) 40 MG capsule TAKE 1 CAPSULE BY MOUTH EVERY DAY 90 capsule 3  . sildenafil (VIAGRA) 100 MG tablet Take 1 tablet (100 mg total) by mouth daily as needed for erectile dysfunction. 10 tablet 11  . temazepam (RESTORIL) 15 MG capsule TAKE 2 CAPSULES BY MOUTH AT BEDTIME AS NEEDED (Patient taking differently: Take 15 mg by mouth at bedtime as needed for sleep. TAKE 2 CAPSULES BY MOUTH AT  BEDTIME AS NEEDED) 60 capsule 5   No current facility-administered medications for this visit.     These are all medicines he takes as needed ___________________________________________________________________ Objective   Exam:  Ht 5\' 9"  (1.753 m)   Wt 175 lb (79.4 kg)   BMI 25.84 kg/m    No exam - virtual visit  Labs:  CMP Latest Ref Rng & Units 11/03/2018 09/06/2018 02/28/2018  Glucose 70 - 99 mg/dL 102(H) 105(H) 97  BUN 6 - 23 mg/dL 13 13 16   Creatinine 0.40 - 1.50 mg/dL 0.94 0.84 0.92  Sodium 135 - 145 mEq/L 138 135 137  Potassium 3.5 - 5.1 mEq/L 4.7 3.5 4.3  Chloride 96 - 112 mEq/L 101 102 100  CO2 19 - 32 mEq/L 29 28 28   Calcium 8.4 - 10.5 mg/dL 10.1 8.9 9.3  Total Protein 6.0 - 8.3 g/dL  7.0 6.6 6.9  Total Bilirubin 0.2 - 1.2 mg/dL 0.8 0.8 0.8  Alkaline Phos 39 - 117 U/L 35(L) 38 35(L)  AST 0 - 37 U/L 47(H) 22 20  ALT 0 - 53 U/L 60(H) 24 18   Negative acute hepatitis panel 11/03/18  Radiologic Studies:  CLINICAL DATA:  Right lower quadrant pain, elevated white count 10.9   EXAM: CT ABDOMEN AND PELVIS WITH CONTRAST   TECHNIQUE: Multidetector CT imaging of the abdomen and pelvis was performed using the standard protocol following bolus administration of intravenous contrast.   CONTRAST:  157mL ISOVUE-300 IOPAMIDOL (ISOVUE-300) INJECTION 61%   COMPARISON:  None.   FINDINGS: Lower chest: Minor dependent basilar atelectasis. Normal heart size. No pericardial pleural effusion.   Hepatobiliary: No focal liver abnormality is seen. No gallstones, gallbladder wall thickening, or biliary dilatation.   Pancreas: Unremarkable. No pancreatic ductal dilatation or surrounding inflammatory changes.   Spleen: Normal in size without focal abnormality.   Adrenals/Urinary Tract: Adrenal glands are unremarkable. Kidneys are normal, without renal calculi, focal lesion, or hydronephrosis. Bladder is unremarkable.   Stomach/Bowel: Negative for bowel obstruction,  significant dilatation, ileus, or free air. Appendix is abnormal with mild distension, mucosal enhancement and minor strandy edema. Findings compatible with acute nonruptured appendicitis.   Appendix: Location: Retrocecal   Diameter: 10 mm   Appendicolith: None visualized   Mucosal hyper-enhancement: Present   Extraluminal gas: None visualized   Periappendiceal collection: None visualized   Vascular/Lymphatic: No significant vascular findings are present. No enlarged abdominal or pelvic lymph nodes.   Reproductive: Prostate is unremarkable.   Other: No abdominal wall hernia or abnormality. No abdominopelvic ascites.   Musculoskeletal: No acute or significant osseous findings.   IMPRESSION: Acute nonruptured appendicitis.     Electronically Signed   By: Jerilynn Mages.  Shick M.D.   On: 09/06/2018 11:48  _______________________________________________________  CLINICAL DATA:  Elevated transaminase   EXAM: ABDOMEN ULTRASOUND COMPLETE   COMPARISON:  CT 09/06/2018   FINDINGS: Gallbladder: No gallstones or wall thickening visualized. No sonographic Murphy sign noted by sonographer.   Common bile duct: Diameter: 3 mm.   Liver: No focal lesion identified. Within normal limits in parenchymal echogenicity. Portal vein is patent on color Doppler imaging with normal direction of blood flow towards the liver.   IVC: No abnormality visualized.   Pancreas: Visualized portion unremarkable.   Spleen: Size and appearance within normal limits.   Right Kidney: Length: 11.0 cm. Echogenicity within normal limits. No mass or hydronephrosis visualized.   Left Kidney: Length: 11.9 cm. Echogenicity within normal limits. No mass or hydronephrosis visualized. 0.8 cm cyst in the mid left kidney is stable.   Abdominal aorta: No aneurysm visualized.   Other findings: None.   IMPRESSION: No acute intra-abdominal pathology.     Electronically Signed   By: Marybelle Killings M.D.   On:  11/10/2018 14:36   Assessment: Encounter Diagnoses  Name Primary?  . Abdominal pain, chronic, epigastric Yes  . Special screening for malignant neoplasms, colon   . Transaminitis     Pain is intermittent, somewhat difficult to characterize, but at times reportedly severe such as several days ago.  He has not noticed any clear or consistent triggers.  It seems unlikely to be related to the LFT elevation, which is AST predominant and suggest the possibility of alcohol as a contributing factor.  He rarely uses Tylenol or NSAIDs, and all his other medicines are as needed. Its improvement without other intervention speaks against autoimmune liver disease.  Plan:  Upper endoscopy for evaluation of the abdominal pain.  Possible biopsy to rule out H. Pylori.  Screening colonoscopy, as he is only few months shy of age 76 and last colonoscopy was 10 years ago in the setting of rectal bleeding.  He was agreeable to procedures after discussion of risks and benefits.  The benefits and risks of the planned procedure were described in detail with the patient or (when appropriate) their health care proxy.  Risks were outlined as including, but not limited to, bleeding, infection, perforation, adverse medication reaction leading to cardiac or pulmonary decompensation.  The limitation of incomplete mucosal visualization was also discussed.  No guarantees or warranties were given.  My office will contact him to schedule the procedures.  Thank you for the courtesy of this consult.  Please call me with any questions or concerns.  Nelida Meuse III  CC: Referring provider noted above

## 2019-03-11 MED ORDER — NA SULFATE-K SULFATE-MG SULF 17.5-3.13-1.6 GM/177ML PO SOLN: 1.0000 | Freq: Once | ORAL | 0 refills | Status: AC

## 2019-03-11 NOTE — Addendum Note (Signed)
Addended by: Elias Else on: 03/11/2019 11:13 AM   Modules accepted: Orders

## 2019-03-11 NOTE — Patient Instructions (Signed)
If you are age 50 or older, your body mass index should be between 23-30. Your Body mass index is 25.84 kg/m. If this is out of the aforementioned range listed, please consider follow up with your Primary Care Provider.  If you are age 57 or younger, your body mass index should be between 19-25. Your Body mass index is 25.84 kg/m. If this is out of the aformentioned range listed, please consider follow up with your Primary Care Provider.   You have been scheduled for a colonoscopy. Please follow written instructions given to you at your visit today.  Please pick up your prep supplies at the pharmacy within the next 1-3 days. If you use inhalers (even only as needed), please bring them with you on the day of your procedure. Your physician has requested that you go to www.startemmi.com and enter the access code given to you at your visit today. This web site gives a general overview about your procedure. However, you should still follow specific instructions given to you by our office regarding your preparation for the procedure.  It was a pleasure to see you today!  Dr. Loletha Carrow

## 2019-03-11 NOTE — Telephone Encounter (Signed)
-----   Message from [place], MD sent at 03/10/2019  4:42 PM EDT ----- Please review my note and contact this patient to arrange EGD and colonoscopy with me in the [place].  He might be interested in my Saturday block available in August.

## 2019-03-11 NOTE — Telephone Encounter (Signed)
Left message to return call 

## 2019-03-19 ENCOUNTER — Telehealth: Payer: Self-pay | Admitting: Gastroenterology

## 2019-03-19 NOTE — Telephone Encounter (Signed)
Pt responded "no" to all screening questions °

## 2019-03-19 NOTE — Telephone Encounter (Signed)
Left message for patient regarding Covid-19 screening questions. °Covid-19 Screening Questions: °  °Do you now or have you had a fever in the last 14 days?  °  °Do you have any respiratory symptoms of shortness of breath or cough now or in the last 14 days?  °  °Do you have any family members or close contacts with diagnosed or suspected Covid-19 in the past 14 days?  °  °Have you been tested for Covid-19 and found to be positive?  °  ° °

## 2019-03-20 ENCOUNTER — Other Ambulatory Visit: Payer: Self-pay

## 2019-03-20 ENCOUNTER — Encounter: Payer: Self-pay | Admitting: Gastroenterology

## 2019-03-20 ENCOUNTER — Ambulatory Visit (AMBULATORY_SURGERY_CENTER): Payer: Commercial Managed Care - PPO | Admitting: Gastroenterology

## 2019-03-20 VITALS — BP 113/68 | HR 61 | Temp 98.4°F | Resp 13 | Ht 69.0 in | Wt 175.0 lb

## 2019-03-20 DIAGNOSIS — Z1211 Encounter for screening for malignant neoplasm of colon: Secondary | ICD-10-CM

## 2019-03-20 DIAGNOSIS — K2970 Gastritis, unspecified, without bleeding: Secondary | ICD-10-CM

## 2019-03-20 DIAGNOSIS — R1013 Epigastric pain: Secondary | ICD-10-CM

## 2019-03-20 HISTORY — PX: ESOPHAGOGASTRODUODENOSCOPY: SHX1529

## 2019-03-20 HISTORY — PX: COLONOSCOPY: SHX174

## 2019-03-20 MED ORDER — SODIUM CHLORIDE 0.9 % IV SOLN: 500.0000 mL | Freq: Once | INTRAVENOUS | Status: DC

## 2019-03-20 NOTE — Patient Instructions (Signed)
Discharge instructions given. Normal colon. Biopsies on Endo. Resume previous medications. YOU HAD AN ENDOSCOPIC PROCEDURE TODAY AT [facility]:   Refer to the procedure report that was given to you for any specific questions about what was found during the examination.  If the procedure report does not answer your questions, please call your gastroenterologist to clarify.  If you requested that your care partner not be given the details of your procedure findings, then the procedure report has been included in a sealed envelope for you to review at your convenience later.  YOU SHOULD EXPECT: Some feelings of bloating in the abdomen. Passage of more gas than usual.  Walking can help get rid of the air that was put into your GI tract during the procedure and reduce the bloating. If you had a lower endoscopy (such as a colonoscopy or flexible sigmoidoscopy) you may notice spotting of blood in your stool or on the toilet paper. If you underwent a bowel prep for your procedure, you may not have a normal bowel movement for a few days.  Please Note:  You might notice some irritation and congestion in your nose or some drainage.  This is from the oxygen used during your procedure.  There is no need for concern and it should clear up in a day or so.  SYMPTOMS TO REPORT IMMEDIATELY:   Following lower endoscopy (colonoscopy or flexible sigmoidoscopy):  Excessive amounts of blood in the stool  Significant tenderness or worsening of abdominal pains  Swelling of the abdomen that is new, acute  Fever of 100F or higher   Following upper endoscopy (EGD)  Vomiting of blood or coffee ground material  New chest pain or pain under the shoulder blades  Painful or persistently difficult swallowing  New shortness of breath  Fever of 100F or higher  Black, tarry-looking stools  For urgent or emergent issues, a gastroenterologist can be reached at any hour by calling [phone].   DIET:   We do recommend a small meal at first, but then you may proceed to your regular diet.  Drink plenty of fluids but you should avoid alcoholic beverages for 24 hours.  ACTIVITY:  You should plan to take it easy for the rest of today and you should NOT DRIVE or use heavy machinery until tomorrow (because of the sedation medicines used during the test).    FOLLOW UP: Our staff will call the number listed on your records 48-72 hours following your procedure to check on you and address any questions or concerns that you may have regarding the information given to you following your procedure. If we do not reach you, we will leave a message.  We will attempt to reach you two times.  During this call, we will ask if you have developed any symptoms of COVID 19. If you develop any symptoms (ie: fever, flu-like symptoms, shortness of breath, cough etc.) before then, please call [phone].  If you test positive for Covid 19 in the 2 weeks post procedure, please call and report this information to [place].    If any biopsies were taken you will be contacted by phone or by letter within the next 1-3 weeks.  Please call us at [phone] if you have not heard about the biopsies in 3 weeks.    SIGNATURES/CONFIDENTIALITY: You and/or your care partner have signed paperwork which will be entered into your electronic medical record.  These signatures attest to the fact that that the information  above on your After Visit Summary has been reviewed and is understood.  Full responsibility of the confidentiality of this discharge information lies with you and/or your care-partner. 

## 2019-03-20 NOTE — Progress Notes (Signed)
Called to room to assist during endoscopic procedure.  Patient ID and intended procedure confirmed with present staff. Received instructions for my participation in the procedure from the performing physician.  

## 2019-03-20 NOTE — Op Note (Signed)
[place] Patient Name: Cody Wyatt Procedure Date: 03/20/2019 2:27 PM MRN: [id number] Endoscopist: Mallie Mussel L. Loletha Carrow , MD Age: 48 Referring MD:  Date of Birth: 1971-08-14 Gender: Male Account #: [id number] Procedure:                Colonoscopy Indications:              Screening for colorectal malignant neoplasm, This                            is the patient's first colonoscopy Medicines:                Monitored Anesthesia Care Procedure:                Pre-Anesthesia Assessment:                           - Prior to the procedure, a History and Physical                            was performed, and patient medications and                            allergies were reviewed. The patient's tolerance of                            previous anesthesia was also reviewed. The risks                            and benefits of the procedure and the sedation                            options and risks were discussed with the patient.                            All questions were answered, and informed consent                            was obtained. Prior Anticoagulants: The patient has                            taken no previous anticoagulant or antiplatelet                            agents. ASA Grade Assessment: II - A patient with                            mild systemic disease. After reviewing the risks                            and benefits, the patient was deemed in                            satisfactory condition to undergo the procedure.  After obtaining informed consent, the colonoscope                            was passed under direct vision. Throughout the                            procedure, the patient's blood pressure, pulse, and                            oxygen saturations were monitored continuously. The                            Colonoscope was introduced through the anus and                            advanced to the the cecum,  identified by                            appendiceal orifice and ileocecal valve. The                            colonoscopy was performed without difficulty. The                            patient tolerated the procedure well. The quality                            of the bowel preparation was excellent. The                            ileocecal valve, appendiceal orifice, and rectum                            were photographed. Scope In: 2:34:49 PM Scope Out: 1:61:09 PM Scope Withdrawal Time: 0 hours 7 minutes 39 seconds  Total Procedure Duration: 0 hours 11 minutes 50 seconds  Findings:                 The perianal and digital rectal examinations were                            normal.                           The entire examined colon appeared normal on direct                            and retroflexion views. Complications:            No immediate complications. Estimated Blood Loss:     Estimated blood loss: none. Impression:               - The entire examined colon is normal on direct and                            retroflexion views.                           -  No specimens collected. Recommendation:           - Patient has a contact number available for                            emergencies. The signs and symptoms of potential                            delayed complications were discussed with the                            patient. Return to normal activities tomorrow.                            Written discharge instructions were provided to the                            patient.                           - Resume previous diet.                           - Continue present medications.                           - Repeat colonoscopy in 10 years for screening                            purposes.                           - See the other procedure note for documentation of                            additional recommendations. Henry L. Loletha Carrow, MD 03/20/2019 2:57:44 PM This  report has been signed electronically.

## 2019-03-20 NOTE — Op Note (Signed)
[place] Patient Name: Cody Wyatt Procedure Date: 03/20/2019 2:27 PM MRN: [id number] Endoscopist: Mallie Mussel L. Loletha Carrow , MD Age: 48 Referring MD:  Date of Birth: 30-Jul-1971 Gender: Male Account #: [id number] Procedure:                Upper GI endoscopy Indications:              Epigastric abdominal pain (episodic, burning                            quality, normal [place] Jan 2020 - done for elevated                            LFTs) Medicines:                Monitored Anesthesia Care Procedure:                Pre-Anesthesia Assessment:                           - Prior to the procedure, a History and Physical                            was performed, and patient medications and                            allergies were reviewed. The patient's tolerance of                            previous anesthesia was also reviewed. The risks                            and benefits of the procedure and the sedation                            options and risks were discussed with the patient.                            All questions were answered, and informed consent                            was obtained. Prior Anticoagulants: The patient has                            taken no previous anticoagulant or antiplatelet                            agents. ASA Grade Assessment: II - A patient with                            mild systemic disease. After reviewing the risks                            and benefits, the patient was deemed in  satisfactory condition to undergo the procedure.                           After obtaining informed consent, the endoscope was                            passed under direct vision. Throughout the                            procedure, the patient's blood pressure, pulse, and                            oxygen saturations were monitored continuously. The                            Endoscope was introduced through the mouth, and                     advanced to the second part of duodenum. The upper                            GI endoscopy was accomplished without difficulty.                            The patient tolerated the procedure well. Scope In: Scope Out: Findings:                 The larynx was normal.                           The esophagus was normal.                           Patchy mild inflammation characterized by scant                            adherent blood and erythema was found in the                            gastric body. Biopsies were taken with a cold                            forceps for Helicobacter pylori testing using                            CLOtest.                           The exam of the stomach was otherwise normal,                            including on retroflexion.                           A few erosions were found in the duodenal bulb.  The exam of the duodenum was otherwise normal. Complications:            No immediate complications. Estimated Blood Loss:     Estimated blood loss was minimal. Impression:               - Normal larynx.                           - Normal esophagus.                           - Mild gastritis. Biopsied.                           - Duodenal erosions. Recommendation:           - Patient has a contact number available for                            emergencies. The signs and symptoms of potential                            delayed complications were discussed with the                            patient. Return to normal activities tomorrow.                            Written discharge instructions were provided to the                            patient.                           - Resume previous diet.                           - Continue present medications.                           - Await pathology results.                           - Decrease alcohol consumption, as it may decrease                             dyspepsia symptoms.                           - See the other procedure note for documentation of                            additional recommendations. Laura Caldas L. Loletha Carrow, MD 03/20/2019 3:06:17 PM This report has been signed electronically.

## 2019-03-23 ENCOUNTER — Encounter: Payer: Self-pay | Admitting: *Deleted

## 2019-03-23 LAB — HELICOBACTER PYLORI SCREEN-BIOPSY: UREASE: NEGATIVE

## 2019-03-25 ENCOUNTER — Telehealth: Payer: Self-pay | Admitting: *Deleted

## 2019-03-25 NOTE — Telephone Encounter (Signed)
No answer for post procedure call back. Left message for patient to call back with questions or concerns. SM 

## 2019-07-28 ENCOUNTER — Other Ambulatory Visit: Payer: Self-pay

## 2019-07-28 ENCOUNTER — Encounter: Payer: Self-pay | Admitting: Family Medicine

## 2019-07-28 ENCOUNTER — Telehealth (INDEPENDENT_AMBULATORY_CARE_PROVIDER_SITE_OTHER): Payer: Commercial Managed Care - PPO | Admitting: Family Medicine

## 2019-07-28 DIAGNOSIS — M542 Cervicalgia: Secondary | ICD-10-CM | POA: Diagnosis not present

## 2019-07-28 DIAGNOSIS — G8929 Other chronic pain: Secondary | ICD-10-CM

## 2019-07-28 MED ORDER — METHYLPREDNISOLONE 4 MG PO TBPK: ORAL_TABLET | ORAL | 0 refills | Status: DC

## 2019-07-28 NOTE — Progress Notes (Signed)
Virtual Visit via Video Note  I connected with the patient on 07/28/19 at  4:00 PM EST by a video enabled telemedicine application and verified that I am speaking with the correct person using two identifiers.  Location patient: home Location provider:work or home office Persons participating in the virtual visit: patient, provider  I discussed the limitations of evaluation and management by telemedicine and the availability of in person appointments. The patient expressed understanding and agreed to proceed.   HPI: Here for 6 weeks of stiffness and pain in the back of the neck. This is mild and not severe. He has tried some Ibuprofen and heat with no relief. No recent trauma, but he is spending more time working on his computer every day than ever before. No headaches or pain down the arms.    ROS: See pertinent positives and negatives per HPI.  Past Medical History:  Diagnosis Date  . Anal fissure   . Anxiety   . Hyperhydrosis disorder   . Insomnia   . Low back pain     Past Surgical History:  Procedure Laterality Date  . colonoscopy  05-10-09   per Dr. Deatra Ina, normal   . LAPAROSCOPIC APPENDECTOMY N/A 09/06/2018   Procedure: APPENDECTOMY LAPAROSCOPIC;  Surgeon: Armandina Gemma, MD;  Location: [facility];  Service: General;  Laterality: N/A;    Family History  Problem Relation Age of Onset  . Colon polyps Father   . Leukemia Daughter   . AAA (abdominal aortic aneurysm) Neg Hx      Current Outpatient Medications:  .  clonazePAM (KLONOPIN) 1 MG tablet, TAKE 1 TABLET BY MOUTH TWICE A DAY AS NEEDED (Patient taking differently: Take 1 mg by mouth daily as needed for anxiety. TAKE 1 TABLET BY MOUTH TWICE A DAY AS NEEDED), Disp: 60 tablet, Rfl: 5 .  hydrocortisone-pramoxine (ANALPRAM-HC) 2.5-1 % rectal cream, INSERT 1 APPLICATORFUL RECTALLY 3 TIMES A DAY (Patient taking differently: Place 1 application rectally daily as needed for hemorrhoids. INSERT 1 APPLICATORFUL RECTALLY 3 TIMES A  DAY), Disp: 30 g, Rfl: 5 .  metaxalone (SKELAXIN) 800 MG tablet, Take 1 tablet (800 mg total) by mouth 3 (three) times daily as needed. (Patient taking differently: Take 800 mg by mouth 3 (three) times daily as needed for muscle spasms. ), Disp: 270 tablet, Rfl: 3 .  omeprazole (PRILOSEC) 40 MG capsule, TAKE 1 CAPSULE BY MOUTH EVERY DAY, Disp: 90 capsule, Rfl: 3 .  temazepam (RESTORIL) 15 MG capsule, TAKE 2 CAPSULES BY MOUTH AT BEDTIME AS NEEDED, Disp: 60 capsule, Rfl: 5 .  methylPREDNISolone (MEDROL DOSEPAK) 4 MG TBPK tablet, As directed, Disp: 21 tablet, Rfl: 0  EXAM:  VITALS per patient if applicable:  GENERAL: alert, oriented, appears well and in no acute distress  HEENT: atraumatic, conjunttiva clear, no obvious abnormalities on inspection of external nose and ears  NECK: normal movements of the head and neck  LUNGS: on inspection no signs of respiratory distress, breathing rate appears normal, no obvious gross SOB, gasping or wheezing  CV: no obvious cyanosis  MS: moves all visible extremities without noticeable abnormality  PSYCH/NEURO: pleasant and cooperative, no obvious depression or anxiety, speech and thought processing grossly intact  ASSESSMENT AND PLAN: Neck pain, he will try a Medrol dose pack. He also has Skelaxin at home and he will take this 3-4 times daily. Recheck if not better in one week.  Alysia Penna, MD  Discussed the following assessment and plan:  No diagnosis found.  I discussed the assessment and treatment plan with the patient. The patient was provided an opportunity to ask questions and all were answered. The patient agreed with the plan and demonstrated an understanding of the instructions.   The patient was advised to call back or seek an in-person evaluation if the symptoms worsen or if the condition fails to improve as anticipated.

## 2019-08-06 ENCOUNTER — Telehealth: Payer: Self-pay | Admitting: Family Medicine

## 2019-08-06 NOTE — Telephone Encounter (Signed)
[id number] [address], [place] Phone:  [phone]  Fax:  [phone]     PT was seen on 12/8 for stiff neck really not any better, was taking some old metaxalone (SKELAXIN) 800 MG tablet not really working either, wanted to know if you could prescribe something different (muscle relaxer) it has been 2 mo. FU up with pt if meds not an option

## 2019-08-06 NOTE — Telephone Encounter (Signed)
Message Routed to PCP CMA 

## 2019-08-07 MED ORDER — CYCLOBENZAPRINE HCL 10 MG PO TABS: 10.0000 mg | ORAL_TABLET | Freq: Three times a day (TID) | ORAL | 2 refills | Status: DC | PRN

## 2019-08-07 NOTE — Telephone Encounter (Signed)
Call in Flexeril 10 mg to take every 8 hours prn muscle spasms, #60 with 2 rf

## 2019-08-11 ENCOUNTER — Ambulatory Visit: Payer: Commercial Managed Care - PPO | Attending: Internal Medicine

## 2019-08-11 ENCOUNTER — Other Ambulatory Visit: Payer: Commercial Managed Care - PPO

## 2019-08-11 DIAGNOSIS — Z20822 Contact with and (suspected) exposure to covid-19: Secondary | ICD-10-CM

## 2019-08-12 ENCOUNTER — Ambulatory Visit: Payer: Self-pay

## 2019-08-12 NOTE — Telephone Encounter (Signed)
Message Routed to PCP CMA 

## 2019-08-12 NOTE — Telephone Encounter (Signed)
Patient called stating that his daughter tested positive on Dec 18 for COVID-19. He states that she had mild symptoms but the only symptom she has now is she is tired. He, his wife, younger daughter and mother in law live in the home and have tested yesterday. They have not seen results.  No one is showing symptoms. CDC recommendations for isolation of his daughter were discussed with patient. Quarantine per [employer] were read to patient for himself and others in the home.Kermit Balo preventative practices were discussed. He verbalized understanding. He was encouraged to call for additional questions and if anyone becomes symptomatic. He verbalized understanding of all information.  Reason for Disposition . [1] CLOSE CONTACT COVID-19 EXPOSURE within last 14 days AND [2] NO symptoms  Answer Assessment - Initial Assessment Questions 1. COVID-19 CLOSE CONTACT: "Who is the person with the confirmed or suspected COVID-19 infection that you were exposed to?"     Daughter 18 dec 2. PLACE of CONTACT: "Where were you when you were exposed to COVID-19?" (e.g., home, school, medical waiting room; which city?)     home 3. TYPE of CONTACT: "How much contact was there?" (e.g., sitting next to, live in same house, work in same office, same building)     Living in same house 4. DURATION of CONTACT: "How long were you in contact with the COVID-19 patient?" (e.g., a few seconds, passed by person, a few minutes, 15 minutes or longer, live with the patient)     Live with 5. MASK: "Were you wearing a mask?" "Was the other person wearing a mask?" Note: wearing a mask reduces the risk of an  otherwise close contact.     ongoing 6. DATE of CONTACT: "When did you have contact with a COVID-19 patient?" (e.g., how many days ago)     18th 7. COMMUNITY SPREAD: "Are there lots of cases of COVID-19 (community spread) where you live?" (See public health department website, if unsure)       yes 8. SYMPTOMS: "Do you have any  symptoms?" (e.g., fever, cough, breathing difficulty, loss of taste or smell)     None 9. PREGNANCY OR POSTPARTUM: "Is there any chance you are pregnant?" "When was your last menstrual period?" "Did you deliver in the last 2 weeks?"    N/A 10. HIGH RISK: "Do you have any heart or lung problems? Do you have a weak immune system?" (e.g., heart failure, COPD, asthma, HIV positive, chemotherapy, renal failure, diabetes mellitus, sickle cell anemia, obesity)      none 11.  TRAVEL: "Have you traveled out of the country recently?" If so, "When and where?"  Also ask about out-of-state travel, since the CDC has identified some high-risk cities for community spread in the [place].  Note: Travel becomes less relevant if there is widespread community transmission where the patient lives.      N/A  Protocols used: CORONAVIRUS (COVID-19) EXPOSURE-A-AH

## 2019-08-12 NOTE — Telephone Encounter (Signed)
Will send to Dr. Fry as FYI 

## 2019-08-13 LAB — NOVEL CORONAVIRUS, NAA: SARS-CoV-2, NAA: NOT DETECTED

## 2019-09-17 ENCOUNTER — Ambulatory Visit: Payer: Commercial Managed Care - PPO | Attending: Internal Medicine

## 2019-09-17 DIAGNOSIS — Z20822 Contact with and (suspected) exposure to covid-19: Secondary | ICD-10-CM

## 2019-09-18 ENCOUNTER — Ambulatory Visit: Payer: Self-pay

## 2019-09-18 LAB — NOVEL CORONAVIRUS, NAA: SARS-CoV-2, NAA: DETECTED — AB

## 2019-09-18 NOTE — Telephone Encounter (Signed)
Noted. Will send to Dr. Fry as FYI 

## 2019-09-18 NOTE — Telephone Encounter (Signed)
Provided covid lab results to patient.  Voiced understanding.  Reviewed isolation and stopping Isolation.  Provided care advice.  Will notify HD.  Voiced understanding.  Sx include low grade fever ,sore throat and cough.

## 2019-09-22 ENCOUNTER — Other Ambulatory Visit: Payer: Commercial Managed Care - PPO

## 2019-09-23 ENCOUNTER — Other Ambulatory Visit: Payer: Self-pay | Admitting: Family Medicine

## 2019-09-23 NOTE — Telephone Encounter (Signed)
Last filled 03/18/2019 Last OV 07/28/2019  Ok to fill?

## 2019-10-20 ENCOUNTER — Other Ambulatory Visit: Payer: Self-pay

## 2019-10-21 ENCOUNTER — Ambulatory Visit (INDEPENDENT_AMBULATORY_CARE_PROVIDER_SITE_OTHER): Payer: Commercial Managed Care - PPO | Admitting: Family Medicine

## 2019-10-21 ENCOUNTER — Encounter: Payer: Self-pay | Admitting: Family Medicine

## 2019-10-21 ENCOUNTER — Ambulatory Visit (INDEPENDENT_AMBULATORY_CARE_PROVIDER_SITE_OTHER): Payer: Commercial Managed Care - PPO

## 2019-10-21 VITALS — BP 120/70 | HR 67 | Temp 98.0°F | Wt 187.0 lb

## 2019-10-21 DIAGNOSIS — M542 Cervicalgia: Secondary | ICD-10-CM | POA: Diagnosis not present

## 2019-10-21 NOTE — Progress Notes (Signed)
   Subjective:    Patient ID: Cody Wyatt, male    DOB: 10/18/1970, 49 y.o.   MRN: [id number]  HPI Here for stiffness and mild pain in the neck that started about 4 months ago. No hx of trauma. He has more stiffness than pain. He has tried a Medrol dose pack, Ibuprofen, Skelaxin, and Flexeril but none of these made much of a difference. Heat and stretching helps. 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    Review of Systems  Constitutional: Negative.   Respiratory: Negative.   Cardiovascular: Negative.   Musculoskeletal: Positive for neck pain and neck stiffness.  Neurological: Negative.        Objective:   Physical Exam Constitutional:      General: He is not in acute distress.    Appearance: Normal appearance.  Cardiovascular:     Rate and Rhythm: Normal rate and regular rhythm.     Pulses: Normal pulses.     Heart sounds: Normal heart sounds.  Pulmonary:     Effort: Pulmonary effort is normal.     Breath sounds: Normal breath sounds.  Musculoskeletal:     Comments: His neck is not tender, no crepitus. His ROM is somewhat limited   Neurological:     Mental Status: He is alert.           Assessment & Plan:  Neck pain, likely from some osteoarthritis. We will get Xrays today. Refer to PT.  Alysia Penna, MD

## 2019-11-05 ENCOUNTER — Other Ambulatory Visit: Payer: Self-pay

## 2019-11-05 ENCOUNTER — Ambulatory Visit: Payer: Commercial Managed Care - PPO | Attending: Family Medicine

## 2019-11-05 DIAGNOSIS — M542 Cervicalgia: Secondary | ICD-10-CM | POA: Insufficient documentation

## 2019-11-05 DIAGNOSIS — R252 Cramp and spasm: Secondary | ICD-10-CM | POA: Insufficient documentation

## 2019-11-05 DIAGNOSIS — R293 Abnormal posture: Secondary | ICD-10-CM

## 2019-11-05 NOTE — Patient Instructions (Addendum)
Access Code: [email or web address] URL: [email or web address] Date: 11/05/2019 Prepared by: Claiborne Billings  Exercises Seated Cervical Flexion AROM - 3 x daily - 7 x weekly - 3 reps - 1 sets - 20 hold Seated Cervical Sidebending AROM - 3 x daily - 7 x weekly - 3 reps - 1 sets - 20 hold Seated Cervical Rotation AROM - 3 x daily - 7 x weekly - 3 reps - 1 sets - 20 hold Seated Correct Posture - 1 x daily - 7 x weekly - 10 reps - 3 sets Cervical Rotation Prone on Elbows - 2 x daily - 7 x weekly - 1 sets - 10 reps   Trigger Point Dry Needling  . What is Trigger Point Dry Needling (DN)? o DN is a physical therapy technique used to treat muscle pain and dysfunction. Specifically, DN helps deactivate muscle trigger points (muscle knots).  o A thin filiform needle is used to penetrate the skin and stimulate the underlying trigger point. The goal is for a local twitch response (LTR) to occur and for the trigger point to relax. No medication of any kind is injected during the procedure.   . What Does Trigger Point Dry Needling Feel Like?  o The procedure feels different for each individual patient. Some patients report that they do not actually feel the needle enter the skin and overall the process is not painful. Very mild bleeding may occur. However, many patients feel a deep cramping in the muscle in which the needle was inserted. This is the local twitch response.   Marland Kitchen How Will I feel after the treatment? o Soreness is normal, and the onset of soreness may not occur for a few hours. Typically this soreness does not last longer than two days.  o Bruising is uncommon, however; ice can be used to decrease any possible bruising.  o In rare cases feeling tired or nauseous after the treatment is normal. In addition, your symptoms may get worse before they get better, this period will typically not last longer than 24 hours.   . What Can I do After My Treatment? o Increase your hydration by drinking more water  for the next 24 hours. o You may place ice or heat on the areas treated that have become sore, however, do not use heat on inflamed or bruised areas. Heat often brings more relief post needling. o You can continue your regular activities, but vigorous activity is not recommended initially after the treatment for 24 hours. o DN is best combined with other physical therapy such as strengthening, stretching, and other therapies.    [facility] [address] [place], [place] [address] Phone # [phone] Fax [phone]

## 2019-11-05 NOTE — Therapy (Signed)
[facility] [address] [place], [place], [address] Phone: [phone]   Fax:  [phone]  Physical Therapy Evaluation  Patient Details  Name: Cody Wyatt MRN: [id number] Date of Birth: August 26, 1970 Referring Provider (PT): Alysia Penna, MD   Encounter Date: 11/05/2019  PT End of Session - 11/05/19 0834    Visit Number  1    Date for PT Re-Evaluation  12/31/19    Authorization Type  UMR    PT Start Time  0805    PT Stop Time  0840    PT Time Calculation (min)  35 min    Activity Tolerance  Patient tolerated treatment well    Behavior During Therapy  [facility] for tasks assessed/performed       Past Medical History:  Diagnosis Date  . Anal fissure   . Anxiety   . Hyperhydrosis disorder   . Insomnia   . Low back pain     Past Surgical History:  Procedure Laterality Date  . colonoscopy  05-10-09   per Dr. Deatra Ina, normal   . LAPAROSCOPIC APPENDECTOMY N/A 09/06/2018   Procedure: APPENDECTOMY LAPAROSCOPIC;  Surgeon: Armandina Gemma, MD;  Location: [facility];  Service: General;  Laterality: N/A;    There were no vitals filed for this visit.   Subjective Assessment - 11/05/19 0805    Subjective  Pt presents to PT with complaints of neck pain that began 6 months ago without cause.  Pt travels for work and uses the phone/computer a lot.    Diagnostic tests  x-ray: minimal spurring    Patient Stated Goals  reduce neck pain    Currently in Pain?  Yes    Pain Score  2    up to 4/10   Pain Location  Neck    Pain Orientation  Left;Right    Pain Descriptors / Indicators  Sore;Constant;Tightness    Pain Type  Chronic pain    Pain Onset  More than a month ago    Pain Frequency  Constant    Aggravating Factors   sleeping (pain upon waking), fairly constant    Pain Relieving Factors  being active, topical rub         [facility] PT Assessment - 11/05/19 0001      Assessment   Medical Diagnosis  neck pain    Referring  Provider (PT)  Alysia Penna, MD    Onset Date/Surgical Date  05/29/19    Hand Dominance  Right    Next MD Visit  as needed    Prior Therapy  none      Precautions   Precautions  None      Restrictions   Weight Bearing Restrictions  No      Balance Screen   Has the patient fallen in the past 6 months  No    Has the patient had a decrease in activity level because of a fear of falling?   No    Is the patient reluctant to leave their home because of a fear of falling?   No      Prior Function   Level of Independence  Independent    Vocation  Full time employment    Vocation Requirements  driving, computer, desk    Leisure  hiking, mountain biking, running      Cognition   Overall Cognitive Status  Within Functional Limits for tasks assessed      Observation/Other Assessments   Focus on Therapeutic Outcomes (  FOTO)   31% limitation      Posture/Postural Control   Posture/Postural Control  Postural limitations    Postural Limitations  Flexed trunk      ROM / Strength   AROM / PROM / Strength  AROM;PROM;Strength      AROM   Overall AROM   Deficits    Overall AROM Comments  UE AROM is full    AROM Assessment Site  Cervical    Cervical Flexion  70    Cervical Extension  60    Cervical - Right Side Bend  45    Cervical - Left Side Bend  45    Cervical - Right Rotation  60    Cervical - Left Rotation  65      PROM   Overall PROM   Within functional limits for tasks performed    Overall PROM Comments  full cervical P/ROM with pain at end range rotation      Strength   Overall Strength  Within functional limits for tasks performed    Overall Strength Comments  5/5 UE strength      Palpation   Spinal mobility  reduced PA mobility C2-7 without pain.    Palpation comment  tension in bil upper traps       Transfers   Transfers  Independent with all Transfers      Ambulation/Gait   Gait Pattern  Within Functional Limits                Objective measurements  completed on examination: See above findings.              PT Education - 11/05/19 0834    Education Details  Access Code: [email or web address], DN info    Person(s) Educated  Patient    Methods  Explanation;Demonstration;Handout    Comprehension  Verbalized understanding;Returned demonstration       PT Short Term Goals - 11/05/19 0757      PT SHORT TERM GOAL #1   Title  be independent in initial HEP    Time  4    Period  Weeks    Status  New    Target Date  12/03/19      PT SHORT TERM GOAL #2   Title  demonstrate 75 degrees of bil cervical A/ROM to improve safety with driving    Time  4    Period  Weeks    Status  New    Target Date  12/03/19      PT SHORT TERM GOAL #3   Title  report > or = to 50% reduction in neck pain with turning head and upon waking in the morning    Time  4    Period  Weeks    Status  New    Target Date  12/03/19        PT Long Term Goals - 11/05/19 0757      PT LONG TERM GOAL #1   Title  be independent in advanced HEP    Time  8    Period  Weeks    Status  New    Target Date  12/31/19      PT LONG TERM GOAL #2   Title  reduce FOTO to < or = to 27% limitation    Time  8    Period  Weeks    Status  New    Target Date  12/31/19      PT LONG TERM  GOAL #3   Title  demonstrate > or = to 80 degrees cervical A/ROM rotation to improve safety with driving    Time  8    Period  Weeks    Status  New    Target Date  12/31/19      PT LONG TERM GOAL #4   Title  sit with neutral seated posture and report corrections and postion changes for long stretches at the computer    Time  8    Period  Weeks    Status  New    Target Date  12/31/19      PT LONG TERM GOAL #5   Title  report a 75% reduction in neck pain with driving and upon waking in the morning    Time  8    Period  Weeks    Status  New    Target Date  12/31/19             Plan - 11/05/19 0906    Clinical Impression Statement  Pt presents to PT with a 6 month history of  neck pain of a chronic nature.  Pt reports 4-7/10 neck pain that is worse in the mornings upon waking and with driving.  Pt sits with flexed trunk and feet under the chair and is able to make to make postural corrections with verbal cues.  Pt with limited cervical A/ROM into rotation with substitution at end range of this motion.  Pt with reduced cervical segmental mobility and tension over bil upper traps.  Pt will benefit from skilled PT to address cervical mobility, postural strength and manual therapy to address tension.    Examination-Activity Limitations  Sleep    Stability/Clinical Decision Making  Stable/Uncomplicated    Clinical Decision Making  Low    Rehab Potential  Excellent    PT Frequency  2x / week    PT Duration  8 weeks    PT Treatment/Interventions  ADLs/Self Care Home Management;Cryotherapy;Artist;Therapeutic activities;Therapeutic exercise;Neuromuscular re-education;Patient/family education;Manual techniques;Dry needling;Taping;Spinal Manipulations;Joint Manipulations    PT Next Visit Plan  Dry needling to neck and upper traps, review HEP, add postural strength    PT Home Exercise Plan  Access Code: [email or web address]    Consulted and Agree with Plan of Care  Patient       Patient will benefit from skilled therapeutic intervention in order to improve the following deficits and impairments:  Postural dysfunction, Improper body mechanics, Impaired flexibility, Pain, Decreased activity tolerance, Increased muscle spasms, Decreased range of motion  Visit Diagnosis: Cervicalgia - Plan: PT plan of care cert/re-cert  Cramp and spasm - Plan: PT plan of care cert/re-cert  Abnormal posture - Plan: PT plan of care cert/re-cert     Problem List Patient Active Problem List   Diagnosis Date Noted  . Appendicitis, acute 09/06/2018  . Acute appendicitis 09/06/2018  . 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 FINGER SPRAIN 12/16/2009   . ACUTE MAXILLARY SINUSITIS 11/24/2009  . ACUTE BRONCHITIS 11/24/2009  . TRANSAMINASES, SERUM, ELEVATED 11/11/2009  . HEMORRHAGE OF RECTUM AND ANUS 05/09/2009  . DEPRESSION 06/23/2008  . LOW BACK PAIN 06/23/2008  . ANXIETY 02/19/2008  . LYMPHADENOPATHY, AXILLA 11/18/2007  . PRURITUS ANI 11/03/2007  . HYPERHIDROSIS 11/03/2007  . BACK PAIN, THORACIC REGION 09/25/2007  . RECTAL FISSURE 07/17/2007  . HEADACHE 04/29/2007  . CHICKENPOX, HX OF 03/19/2007    Sigurd Sos, PT 11/05/19 9:29 AM  [facility] [address]  [place], [place], [place], [address] Phone: [phone]   Fax:  [phone]  Name: MALIKHAI COOPERSMITH MRN: [id number] Date of Birth: [birth year]

## 2019-11-25 ENCOUNTER — Ambulatory Visit: Payer: Commercial Managed Care - PPO | Attending: Family Medicine | Admitting: Physical Therapy

## 2019-11-25 ENCOUNTER — Encounter: Payer: Self-pay | Admitting: Physical Therapy

## 2019-11-25 ENCOUNTER — Other Ambulatory Visit: Payer: Self-pay

## 2019-11-25 DIAGNOSIS — M542 Cervicalgia: Secondary | ICD-10-CM | POA: Diagnosis not present

## 2019-11-25 DIAGNOSIS — R293 Abnormal posture: Secondary | ICD-10-CM | POA: Insufficient documentation

## 2019-11-25 DIAGNOSIS — R252 Cramp and spasm: Secondary | ICD-10-CM | POA: Insufficient documentation

## 2019-11-25 NOTE — Therapy (Signed)
[facility] [address], [place], [place], [address] Phone: [phone]   Fax:  [phone]  Physical Therapy Treatment  Patient Details  Name: Cody Wyatt MRN: [id number] Date of Birth: [birth year] Referring Provider (PT): Alysia Penna, MD   Encounter Date: 11/25/2019  PT End of Session - 11/25/19 0803    Visit Number  2    Date for PT Re-Evaluation  12/31/19    Authorization Type  UMR    PT Start Time  0803    PT Stop Time  0845    PT Time Calculation (min)  42 min    Activity Tolerance  Patient tolerated treatment well    Behavior During Therapy  [facility] for tasks assessed/performed       Past Medical History:  Diagnosis Date  . Anal fissure   . Anxiety   . Hyperhydrosis disorder   . Insomnia   . Low back pain     Past Surgical History:  Procedure Laterality Date  . colonoscopy  05-10-09   per Dr. Deatra Ina, normal   . LAPAROSCOPIC APPENDECTOMY N/A 09/06/2018   Procedure: APPENDECTOMY LAPAROSCOPIC;  Surgeon: Armandina Gemma, MD;  Location: [facility];  Service: General;  Laterality: N/A;    There were no vitals filed for this visit.  Subjective Assessment - 11/25/19 0803    Subjective  Patient compliant with HEP    Diagnostic tests  x-ray: minimal spurring    Patient Stated Goals  reduce neck pain    Currently in Pain?  Yes    Pain Score  2     Pain Location  Neck    Pain Orientation  Left;Right    Pain Descriptors / Indicators  Dull    Pain Type  Chronic pain    Pain Onset  More than a month ago    Pain Frequency  Constant                       [facility] Adult PT Treatment/Exercise - 11/25/19 0001      Exercises   Exercises  Shoulder      Shoulder Exercises: Supine   Other Supine Exercises  thoracic extension over blue foam roller horizontally; also chest stretch on roller in vertical position      Shoulder Exercises: Standing   Horizontal ABduction  Strengthening;Both;10  reps;Theraband    Theraband Level (Shoulder Horizontal ABduction)  Level 4 (Blue)    Extension  Both;15 reps;Theraband    Theraband Level (Shoulder Extension)  Level 4 (Blue)    Row  Both;15 reps;Theraband    Theraband Level (Shoulder Row)  Level 3 (Green)    Row Weight (lbs)  10    Row Limitations  Bent over row with dumbbell x 10 ea    Other Standing Exercises  doorway stretch 2x30 sec    Other Standing Exercises  chin tucks x 5      Manual Therapy   Manual Therapy  Soft tissue mobilization    Manual therapy comments  skilled palpation and monitoring of soft tissues during DN    Soft tissue mobilization  to bil UT and cervical paraspinals       Trigger Point Dry Needling - 11/25/19 0001    Consent Given?  Yes    Education Handout Provided  Previously provided    Muscles Treated Head and Neck  Upper trapezius;Cervical multifidi    Dry Needling Comments  bil    Upper Trapezius Response  Twitch reponse elicited;Palpable increased muscle length    Cervical multifidi Response  Twitch reponse elicited;Palpable increased muscle length           PT Education - 11/25/19 1127    Education Details  HEP progressed    Person(s) Educated  Patient    Methods  Explanation;Demonstration;Handout    Comprehension  Verbalized understanding;Returned demonstration       PT Short Term Goals - 11/05/19 0757      PT SHORT TERM GOAL #1   Title  be independent in initial HEP    Time  4    Period  Weeks    Status  New    Target Date  12/03/19      PT SHORT TERM GOAL #2   Title  demonstrate 75 degrees of bil cervical A/ROM to improve safety with driving    Time  4    Period  Weeks    Status  New    Target Date  12/03/19      PT SHORT TERM GOAL #3   Title  report > or = to 50% reduction in neck pain with turning head and upon waking in the morning    Time  4    Period  Weeks    Status  New    Target Date  12/03/19        PT Long Term Goals - 11/05/19 0757      PT LONG TERM  GOAL #1   Title  be independent in advanced HEP    Time  8    Period  Weeks    Status  New    Target Date  12/31/19      PT LONG TERM GOAL #2   Title  reduce FOTO to < or = to 27% limitation    Time  8    Period  Weeks    Status  New    Target Date  12/31/19      PT LONG TERM GOAL #3   Title  demonstrate > or = to 80 degrees cervical A/ROM rotation to improve safety with driving    Time  8    Period  Weeks    Status  New    Target Date  12/31/19      PT LONG TERM GOAL #4   Title  sit with neutral seated posture and report corrections and postion changes for long stretches at the computer    Time  8    Period  Weeks    Status  New    Target Date  12/31/19      PT LONG TERM GOAL #5   Title  report a 75% reduction in neck pain with driving and upon waking in the morning    Time  8    Period  Weeks    Status  New    Target Date  12/31/19            Plan - 11/25/19 1127    Clinical Impression Statement  Patient tolerated therex well today with no reports of increased pain. He responded well to DN and manual therapy in his neck and upper traps and reported decreased stiffness at end of session. No goals met as only second visit.    PT Frequency  2x / week    PT Duration  8 weeks    PT Treatment/Interventions  ADLs/Self Care Home Management;Cryotherapy;Artist;Therapeutic activities;Therapeutic exercise;Neuromuscular re-education;Patient/family education;Manual techniques;Dry needling;Taping;Spinal Manipulations;Joint Manipulations  PT Next Visit Plan  Assess DN #1 to neck and upper traps, continue with neck and postural strengthening; may benefit from cervical mobs    PT Home Exercise Plan  Access Code: [email or web address]    Consulted and Agree with Plan of Care  Patient       Patient will benefit from skilled therapeutic intervention in order to improve the following deficits and impairments:  Postural dysfunction, Improper  body mechanics, Impaired flexibility, Pain, Decreased activity tolerance, Increased muscle spasms, Decreased range of motion  Visit Diagnosis: Cervicalgia  Cramp and spasm  Abnormal posture     Problem List Patient Active Problem List   Diagnosis Date Noted  . Appendicitis, acute 09/06/2018  . Acute appendicitis 09/06/2018  . 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 FINGER SPRAIN 12/16/2009  . ACUTE MAXILLARY SINUSITIS 11/24/2009  . ACUTE BRONCHITIS 11/24/2009  . TRANSAMINASES, SERUM, ELEVATED 11/11/2009  . HEMORRHAGE OF RECTUM AND ANUS 05/09/2009  . DEPRESSION 06/23/2008  . LOW BACK PAIN 06/23/2008  . ANXIETY 02/19/2008  . LYMPHADENOPATHY, AXILLA 11/18/2007  . PRURITUS ANI 11/03/2007  . HYPERHIDROSIS 11/03/2007  . BACK PAIN, THORACIC REGION 09/25/2007  . RECTAL FISSURE 07/17/2007  . HEADACHE 04/29/2007  . CHICKENPOX, HX OF 03/19/2007    Madelyn Flavors PT 11/25/2019, 11:31 AM  [facility] [address] [place], [place], [address] Phone: [phone]   Fax:  [phone]  Name: Cody Wyatt MRN: [id number] Date of Birth: [birth year]

## 2019-11-25 NOTE — Patient Instructions (Signed)
Access Code: [email or web address] URL: [email or web address] Date: 11/25/2019 Prepared by: Almyra Free Bates [place]  Exercises Seated Cervical Flexion AROM - 3 x daily - 7 x weekly - 3 reps - 1 sets - 20 hold Seated Cervical Sidebending AROM - 3 x daily - 7 x weekly - 3 reps - 1 sets - 20 hold Seated Cervical Rotation AROM - 3 x daily - 7 x weekly - 3 reps - 1 sets - 20 hold Seated Correct Posture - 1 x daily - 7 x weekly - 10 reps - 3 sets Cervical Rotation Prone on Elbows - 2 x daily - 7 x weekly - 1 sets - 10 reps Doorway Pec Stretch at 90 Degrees Abduction - 1 x daily - 7 x weekly - 3 reps - 1 sets - 30-60 seconds hold Seated Cervical Retraction - 3 x daily - 7 x weekly - 10 reps - 1 sets - 3-5 sec hold Bent Over Single Arm Shoulder Row with Dumbbell - 1 x daily - 7 x weekly - 10 reps - 3 sets Thoracic Extension Mobilization on Foam Roll - 2 x daily - 7 x weekly - 5 reps - 2 sets

## 2019-12-01 ENCOUNTER — Encounter: Payer: Self-pay | Admitting: Physical Therapy

## 2019-12-01 ENCOUNTER — Other Ambulatory Visit: Payer: Self-pay

## 2019-12-01 ENCOUNTER — Ambulatory Visit: Payer: Commercial Managed Care - PPO | Admitting: Physical Therapy

## 2019-12-01 DIAGNOSIS — R252 Cramp and spasm: Secondary | ICD-10-CM

## 2019-12-01 DIAGNOSIS — M542 Cervicalgia: Secondary | ICD-10-CM

## 2019-12-01 DIAGNOSIS — R293 Abnormal posture: Secondary | ICD-10-CM

## 2019-12-01 NOTE — Patient Instructions (Signed)
Access Code: [email or web address] URL: [email or web address] Date: 12/01/2019 Prepared by: Almyra Free Tryce Surratt  Exercises Seated Cervical Flexion AROM - 3 x daily - 7 x weekly - 3 reps - 1 sets - 20 hold Seated Cervical Sidebending AROM - 3 x daily - 7 x weekly - 3 reps - 1 sets - 20 hold Seated Cervical Rotation AROM - 3 x daily - 7 x weekly - 3 reps - 1 sets - 20 hold Seated Correct Posture - 1 x daily - 7 x weekly - 10 reps - 3 sets Cervical Rotation Prone on Elbows - 2 x daily - 7 x weekly - 1 sets - 10 reps Doorway Pec Stretch at 90 Degrees Abduction - 1 x daily - 7 x weekly - 3 reps - 1 sets - 30-60 seconds hold Seated Cervical Retraction - 3 x daily - 7 x weekly - 10 reps - 1 sets - 3-5 sec hold Bent Over Single Arm Shoulder Row with Dumbbell - 1 x daily - 7 x weekly - 10 reps - 3 sets Thoracic Extension Mobilization on Foam Roll - 2 x daily - 7 x weekly - 5 reps - 2 sets Cervical Rotation Prone on Elbows - 2-3 x daily - 7 x weekly - 10 reps - 1 sets Standing Single Arm Shoulder Shrug - 1 x daily - 7 x weekly - 3 sets - 10 reps

## 2019-12-01 NOTE — Therapy (Signed)
[facility] [address] [place], [place], [address] Phone: [phone]   Fax:  [phone]  Physical Therapy Treatment  Patient Details  Name: Cody Wyatt MRN: [id number] Date of Birth: Mar 10, 1971 Referring Provider (PT): Alysia Penna, MD   Encounter Date: 12/01/2019  PT End of Session - 12/01/19 1230    Visit Number  3    Date for PT Re-Evaluation  12/31/19    Authorization Type  UMR    PT Start Time  1230    PT Stop Time  1320    PT Time Calculation (min)  50 min    Activity Tolerance  Patient tolerated treatment well    Behavior During Therapy  [facility] for tasks assessed/performed       Past Medical History:  Diagnosis Date  . Anal fissure   . Anxiety   . Hyperhydrosis disorder   . Insomnia   . Low back pain     Past Surgical History:  Procedure Laterality Date  . colonoscopy  05-10-09   per Dr. Deatra Ina, normal   . LAPAROSCOPIC APPENDECTOMY N/A 09/06/2018   Procedure: APPENDECTOMY LAPAROSCOPIC;  Surgeon: Armandina Gemma, MD;  Location: [facility];  Service: General;  Laterality: N/A;    There were no vitals filed for this visit.  Subjective Assessment - 12/01/19 1230    Subjective  I feel like it's loosened up a bit. It is still a little stiff. Less pain at night and in morning.    Patient Stated Goals  reduce neck pain    Currently in Pain?  No/denies                       [facility] Adult PT Treatment/Exercise - 12/01/19 0001      Exercises   Exercises  Neck      Neck Exercises: Standing   Neck Retraction  10 reps    Neck Retraction Limitations  with rotatons on ball    Other Standing Exercises  fwd ball press with rotatiions x 10     Other Standing Exercises  --      Neck Exercises: Seated   Cervical Rotation  Left;15 reps    Cervical Rotation Limitations  1 set x 5; then with traction and AAROM by PT x 5; then with retraction   no click with retraction     Neck Exercises: Supine    Cervical Rotation  10 reps;Both    Cervical Rotation Limitations  on foam roller      Shoulder Exercises: Prone   Other Prone Exercises  neck diagonals Prone on elbows x 10 each way; also done after manual therapy      Shoulder Exercises: Standing   Shoulder Elevation  Strengthening;Both;10 reps;Standing   with bar bell bil x 10; with double blk band in door x 10 ea     Shoulder Exercises: ROM/Strengthening   Cybex Row  15 reps    Cybex Row Limitations  25#; also extension      Manual Therapy   Manual Therapy  Joint mobilization;Soft tissue mobilization    Manual therapy comments  skilled palpation and monitoring of soft tissues during DN    Joint Mobilization  PA mobs central and unilateral cspine Gd III; also mob with movement in sitting for left rotation using pressure on right cspine    Soft tissue mobilization  to bil cervical paraspinals       Trigger Point Dry Needling - 12/01/19 0001  Consent Given?  Yes    Education Handout Provided  Previously provided    Muscles Treated Head and Neck  Cervical multifidi;Splenius capitus    Dry Needling Comments  right    Splenius capitus Response  Palpable increased muscle length    Cervical multifidi Response  Twitch reponse elicited;Palpable increased muscle length           PT Education - 12/01/19 1320    Education Details  HEP    Person(s) Educated  Patient    Methods  Explanation;Demonstration;Handout    Comprehension  Verbalized understanding;Returned demonstration       PT Short Term Goals - 12/01/19 1614      PT SHORT TERM GOAL #1   Title  be independent in initial HEP    Status  Achieved        PT Long Term Goals - 11/05/19 0757      PT LONG TERM GOAL #1   Title  be independent in advanced HEP    Time  8    Period  Weeks    Status  New    Target Date  12/31/19      PT LONG TERM GOAL #2   Title  reduce FOTO to < or = to 27% limitation    Time  8    Period  Weeks    Status  New    Target Date   12/31/19      PT LONG TERM GOAL #3   Title  demonstrate > or = to 80 degrees cervical A/ROM rotation to improve safety with driving    Time  8    Period  Weeks    Status  New    Target Date  12/31/19      PT LONG TERM GOAL #4   Title  sit with neutral seated posture and report corrections and postion changes for long stretches at the computer    Time  8    Period  Weeks    Status  New    Target Date  12/31/19      PT LONG TERM GOAL #5   Title  report a 75% reduction in neck pain with driving and upon waking in the morning    Time  8    Period  Weeks    Status  New    Target Date  12/31/19            Plan - 12/01/19 1322    Clinical Impression Statement  Patient reported less stiffness upon arrival but still feeling click when turning to the left. He responded very well to manual therapy with decreased tissue tension noted right away. He was able to abolish click when correcting cervical posture with mild chin tuck. Patient also reports he has had less pain upon waking and lifting head from pillow.    PT Treatment/Interventions  ADLs/Self Care Home Management;Cryotherapy;Artist;Therapeutic activities;Therapeutic exercise;Neuromuscular re-education;Patient/family education;Manual techniques;Dry needling;Taping;Spinal Manipulations;Joint Manipulations    PT Next Visit Plan  Assess STGs and DN #3 to neck, continue with neck and postural strengthening; add prone cerv retraction    PT Home Exercise Plan  Access Code: [email or web address]    Consulted and Agree with Plan of Care  Patient       Patient will benefit from skilled therapeutic intervention in order to improve the following deficits and impairments:  Postural dysfunction, Improper body mechanics, Impaired flexibility, Pain, Decreased activity tolerance, Increased muscle spasms, Decreased range of motion  Visit Diagnosis: Cervicalgia  Cramp and spasm  Abnormal  posture     Problem List Patient Active Problem List   Diagnosis Date Noted  . Appendicitis, acute 09/06/2018  . Acute appendicitis 09/06/2018  . 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 FINGER SPRAIN 12/16/2009  . ACUTE MAXILLARY SINUSITIS 11/24/2009  . ACUTE BRONCHITIS 11/24/2009  . TRANSAMINASES, SERUM, ELEVATED 11/11/2009  . HEMORRHAGE OF RECTUM AND ANUS 05/09/2009  . DEPRESSION 06/23/2008  . LOW BACK PAIN 06/23/2008  . ANXIETY 02/19/2008  . LYMPHADENOPATHY, AXILLA 11/18/2007  . PRURITUS ANI 11/03/2007  . HYPERHIDROSIS 11/03/2007  . BACK PAIN, THORACIC REGION 09/25/2007  . RECTAL FISSURE 07/17/2007  . HEADACHE 04/29/2007  . CHICKENPOX, HX OF 03/19/2007    Madelyn Flavors PT 12/01/2019, 4:16 PM  [facility] [address], [place], [place], [address] Phone: [phone]   Fax:  [phone]  Name: Cody Wyatt MRN: [id number] Date of Birth: 1970-10-01

## 2019-12-03 ENCOUNTER — Other Ambulatory Visit: Payer: Self-pay

## 2019-12-03 ENCOUNTER — Ambulatory Visit: Payer: Commercial Managed Care - PPO

## 2019-12-03 DIAGNOSIS — R293 Abnormal posture: Secondary | ICD-10-CM

## 2019-12-03 DIAGNOSIS — M542 Cervicalgia: Secondary | ICD-10-CM | POA: Diagnosis not present

## 2019-12-03 DIAGNOSIS — R252 Cramp and spasm: Secondary | ICD-10-CM

## 2019-12-03 NOTE — Therapy (Signed)
[facility] [address] [place], [place], [address] Phone: [phone]   Fax:  [phone]  Physical Therapy Treatment  Patient Details  Name: Cody Wyatt MRN: [id number] Date of Birth: 1971/03/19 Referring Provider (PT): Alysia Penna, MD   Encounter Date: 12/03/2019  PT End of Session - 12/03/19 0852    Visit Number  4    Date for PT Re-Evaluation  12/31/19    Authorization Type  UMR    PT Start Time  0807    PT Stop Time  0848    PT Time Calculation (min)  41 min    Activity Tolerance  Patient tolerated treatment well    Behavior During Therapy  [facility] for tasks assessed/performed       Past Medical History:  Diagnosis Date  . Anal fissure   . Anxiety   . Hyperhydrosis disorder   . Insomnia   . Low back pain     Past Surgical History:  Procedure Laterality Date  . colonoscopy  05-10-09   per Dr. Deatra Ina, normal   . LAPAROSCOPIC APPENDECTOMY N/A 09/06/2018   Procedure: APPENDECTOMY LAPAROSCOPIC;  Surgeon: Armandina Gemma, MD;  Location: [facility];  Service: General;  Laterality: N/A;    There were no vitals filed for this visit.  Subjective Assessment - 12/03/19 0812    Subjective  My neck feels better overall.  I get sore after dry needling.         [facility] PT Assessment - 12/03/19 0001      AROM   Cervical - Right Rotation  70    Cervical - Left Rotation  75                   [facility] Adult PT Treatment/Exercise - 12/03/19 0001      Shoulder Exercises: Supine   Horizontal ABduction  Strengthening;Both;20 reps    Theraband Level (Shoulder Horizontal ABduction)  Level 2 (Red)    Horizontal ABduction Limitations  foam roll    External Rotation  Strengthening;Both;20 reps;Theraband    Theraband Level (Shoulder External Rotation)  Level 3 (Green)    External Rotation Limitations  foam roll    Diagonals  Strengthening;Both;20 reps    Theraband Level (Shoulder Diagonals)  Level 2 (Red)    Diagonals Limitations  foam roll    Other Supine Exercises  Melt Method: supine neck release series       Manual Therapy   Manual Therapy  Joint mobilization;Soft tissue mobilization    Joint Mobilization  PA mobs central and unilateral cspine Gd III; also mob with movement in sitting for left rotation using pressure on right cspine               PT Short Term Goals - 12/03/19 0813      PT SHORT TERM GOAL #1   Title  be independent in initial HEP    Status  Achieved      PT SHORT TERM GOAL #3   Title  report > or = to 50% reduction in neck pain with turning head and upon waking in the morning    Baseline  75% improvement    Status  Achieved        PT Long Term Goals - 11/05/19 0757      PT LONG TERM GOAL #1   Title  be independent in advanced HEP    Time  8    Period  Weeks    Status  New    Target Date  12/31/19      PT LONG TERM GOAL #2   Title  reduce FOTO to < or = to 27% limitation    Time  8    Period  Weeks    Status  New    Target Date  12/31/19      PT LONG TERM GOAL #3   Title  demonstrate > or = to 80 degrees cervical A/ROM rotation to improve safety with driving    Time  8    Period  Weeks    Status  New    Target Date  12/31/19      PT LONG TERM GOAL #4   Title  sit with neutral seated posture and report corrections and postion changes for long stretches at the computer    Time  8    Period  Weeks    Status  New    Target Date  12/31/19      PT LONG TERM GOAL #5   Title  report a 75% reduction in neck pain with driving and upon waking in the morning    Time  8    Period  Weeks    Status  New    Target Date  12/31/19            Plan - 12/03/19 0848    Clinical Impression Statement  Pt reports 75% reduction in neck pain and stiffness since the start of care.  Session today focused on Melt Method and supine theraband for postural strength.  Pt required minor verbal cues to reduce scapular elevation with supine band exercises on  foam roll. Pt with improved tissue mobility and segmental mobility. Cervical A/ROM is improved into rotation bilaterally.  Pt will continue to benefit from skilled PT to address strength, flexibility and tissue and segmental mobility.    PT Frequency  2x / week    PT Duration  8 weeks    PT Treatment/Interventions  ADLs/Self Care Home Management;Cryotherapy;Artist;Therapeutic activities;Therapeutic exercise;Neuromuscular re-education;Patient/family education;Manual techniques;Dry needling;Taping;Spinal Manipulations;Joint Manipulations    PT Next Visit Plan  review Melt Method and supine band on foam roll.  Dry needling next session    PT Home Exercise Plan  Access Code: [email or web address]       Patient will benefit from skilled therapeutic intervention in order to improve the following deficits and impairments:  Postural dysfunction, Improper body mechanics, Impaired flexibility, Pain, Decreased activity tolerance, Increased muscle spasms, Decreased range of motion  Visit Diagnosis: Cervicalgia  Cramp and spasm  Abnormal posture     Problem List Patient Active Problem List   Diagnosis Date Noted  . Appendicitis, acute 09/06/2018  . Acute appendicitis 09/06/2018  . 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 FINGER SPRAIN 12/16/2009  . ACUTE MAXILLARY SINUSITIS 11/24/2009  . ACUTE BRONCHITIS 11/24/2009  . TRANSAMINASES, SERUM, ELEVATED 11/11/2009  . HEMORRHAGE OF RECTUM AND ANUS 05/09/2009  . DEPRESSION 06/23/2008  . LOW BACK PAIN 06/23/2008  . ANXIETY 02/19/2008  . LYMPHADENOPATHY, AXILLA 11/18/2007  . PRURITUS ANI 11/03/2007  . HYPERHIDROSIS 11/03/2007  . BACK PAIN, THORACIC REGION 09/25/2007  . RECTAL FISSURE 07/17/2007  . HEADACHE 04/29/2007  . CHICKENPOX, HX OF 03/19/2007    Sigurd Sos, PT 12/03/19 8:53 AM   [facility] [address], [place], [place],  [address] Phone: [phone]   Fax:  [phone]  Name: AGNEW ZINCK MRN: [id number] Date of Birth: [birth year]

## 2019-12-09 ENCOUNTER — Ambulatory Visit: Payer: Commercial Managed Care - PPO

## 2019-12-09 ENCOUNTER — Other Ambulatory Visit: Payer: Self-pay

## 2019-12-09 DIAGNOSIS — R293 Abnormal posture: Secondary | ICD-10-CM

## 2019-12-09 DIAGNOSIS — M542 Cervicalgia: Secondary | ICD-10-CM

## 2019-12-09 DIAGNOSIS — R252 Cramp and spasm: Secondary | ICD-10-CM

## 2019-12-09 NOTE — Therapy (Signed)
[facility] [address] [place], [place], [address] Phone: [phone]   Fax:  [phone]  Physical Therapy Treatment  Patient Details  Name: Cody Wyatt MRN: [id number] Date of Birth: 12/05/1970 Referring Provider (PT): Alysia Penna, MD   Encounter Date: 12/09/2019  PT End of Session - 12/09/19 0841    Visit Number  5    Date for PT Re-Evaluation  12/31/19    Authorization Type  UMR    PT Start Time  0801    PT Stop Time  0841    PT Time Calculation (min)  40 min    Activity Tolerance  Patient tolerated treatment well    Behavior During Therapy  [facility] for tasks assessed/performed       Past Medical History:  Diagnosis Date  . Anal fissure   . Anxiety   . Hyperhydrosis disorder   . Insomnia   . Low back pain     Past Surgical History:  Procedure Laterality Date  . colonoscopy  05-10-09   per Dr. Deatra Ina, normal   . LAPAROSCOPIC APPENDECTOMY N/A 09/06/2018   Procedure: APPENDECTOMY LAPAROSCOPIC;  Surgeon: Armandina Gemma, MD;  Location: [facility];  Service: General;  Laterality: N/A;    There were no vitals filed for this visit.  Subjective Assessment - 12/09/19 0803    Subjective  No pain.  Things are moving in the right direction.  I just have tightness in the mornings and this goes away.    Currently in Pain?  No/denies                       [facility] Adult PT Treatment/Exercise - 12/09/19 0001      Neck Exercises: Machines for Strengthening   UBE (Upper Arm Bike)  Level 2x 6 minutes (3/3)      Shoulder Exercises: Supine   Horizontal ABduction  Strengthening;Both;20 reps    Theraband Level (Shoulder Horizontal ABduction)  Level 4 (Blue)    Horizontal ABduction Limitations  foam roll    External Rotation  Strengthening;Both;20 reps;Theraband    Theraband Level (Shoulder External Rotation)  Level 4 (Blue)    External Rotation Limitations  foam roll    Diagonals  Strengthening;Both;20  reps    Theraband Level (Shoulder Diagonals)  Level 4 (Blue)    Diagonals Limitations  foam roll      Manual Therapy   Manual Therapy  Joint mobilization;Soft tissue mobilization    Manual therapy comments  skilled palpation and monitoring of soft tissues during DN    Joint Mobilization  PA mobs central and unilateral cspine Gd III; also mob with movement in sitting for left rotation using pressure on right cspine    Soft tissue mobilization  to bil cervical paraspinals       Trigger Point Dry Needling - 12/09/19 0001    Consent Given?  Yes    Education Handout Provided  Previously provided    Muscles Treated Head and Neck  Cervical multifidi;Upper trapezius    Dry Needling Comments  bil    Upper Trapezius Response  Palpable increased muscle length;Twitch reponse elicited    Cervical multifidi Response  Twitch reponse elicited;Palpable increased muscle length             PT Short Term Goals - 12/09/19 0806      PT SHORT TERM GOAL #2   Title  demonstrate 75 degrees of bil cervical A/ROM to improve safety with driving  Baseline  75 and 70    Status  On-going      PT SHORT TERM GOAL #3   Title  report > or = to 50% reduction in neck pain with turning head and upon waking in the morning    Status  Achieved        PT Long Term Goals - 12/09/19 0807      PT LONG TERM GOAL #1   Title  be independent in advanced HEP    Time  8    Period  Weeks    Status  On-going      PT LONG TERM GOAL #4   Title  sit with neutral seated posture and report corrections and postion changes for long stretches at the computer    Status  Achieved            Plan - 12/09/19 0808    Clinical Impression Statement  Pt reports 75-80% reduction in neck pain and stiffness since the start of care.  Pt is independent in postural strength exercises and flexibility for the cervical spine.  Pt required minor verbal cues to reduce scapular elevation with supine band exercises on foam roll. Pt  with tension and trigger points in bil cervical spine and demonstrated improved tissue mobility and segmental mobility after manual therapy and dry needling today. Cervical A/ROM is improved into rotation bilaterally and pt is able to maintain neutral posture with desk work.  Pt is making postural corrections at home and work.   Pt will continue to benefit from skilled PT to address strength, flexibility and tissue and segmental mobility.    PT Frequency  2x / week    PT Duration  8 weeks    PT Treatment/Interventions  ADLs/Self Care Home Management;Cryotherapy;Artist;Therapeutic activities;Therapeutic exercise;Neuromuscular re-education;Patient/family education;Manual techniques;Dry needling;Taping;Spinal Manipulations;Joint Manipulations    PT Next Visit Plan  assess response to dry needling, continue strength and flexibility progression.  Pt will likely reduce to 1x/wk for dry needling and advancement of HEP    PT Home Exercise Plan  Access Code: [email or web address]    Recommended Other Services  cert is signed    Consulted and Agree with Plan of Care  Patient       Patient will benefit from skilled therapeutic intervention in order to improve the following deficits and impairments:  Postural dysfunction, Improper body mechanics, Impaired flexibility, Pain, Decreased activity tolerance, Increased muscle spasms, Decreased range of motion  Visit Diagnosis: Cervicalgia  Cramp and spasm  Abnormal posture     Problem List Patient Active Problem List   Diagnosis Date Noted  . Appendicitis, acute 09/06/2018  . Acute appendicitis 09/06/2018  . 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 FINGER SPRAIN 12/16/2009  . ACUTE MAXILLARY SINUSITIS 11/24/2009  . ACUTE BRONCHITIS 11/24/2009  . TRANSAMINASES, SERUM, ELEVATED 11/11/2009  . HEMORRHAGE OF RECTUM AND ANUS 05/09/2009  . DEPRESSION 06/23/2008  . LOW BACK PAIN 06/23/2008  . ANXIETY 02/19/2008   . LYMPHADENOPATHY, AXILLA 11/18/2007  . PRURITUS ANI 11/03/2007  . HYPERHIDROSIS 11/03/2007  . BACK PAIN, THORACIC REGION 09/25/2007  . RECTAL FISSURE 07/17/2007  . HEADACHE 04/29/2007  . CHICKENPOX, HX OF 03/19/2007     Sigurd Sos, PT 12/09/19 8:43 AM  [facility] [address], [place], [place], [address] Phone: [phone]   Fax:  [phone]  Name: Cody Wyatt MRN: [id number] Date of Birth: 1970-11-12

## 2019-12-22 ENCOUNTER — Encounter: Payer: Commercial Managed Care - PPO | Admitting: Physical Therapy

## 2019-12-24 ENCOUNTER — Encounter: Payer: Commercial Managed Care - PPO | Admitting: Physical Therapy

## 2019-12-25 IMAGING — CT CT ABD-PELV W/ CM
2 of 5 series · 16 of 46 positions shown, 18 images · IV contrast (APPLIED)
Comparison: None.

CLINICAL DATA: Right lower quadrant pain, elevated white count

EXAM:
CT ABDOMEN AND PELVIS WITH CONTRAST
TECHNIQUE: Multidetector CT imaging of the abdomen and pelvis was performed
using the standard protocol following bolus administration of
intravenous contrast.
CONTRAST:  100mL [id number] IOPAMIDOL ([id number]) INJECTION 61%

[Series 2: axial st · axial · 0.84mm/px · z∈[+240,+695]mm · 13 of 103 slices shown, 15 images]
[im 6/103  soft-tissue]
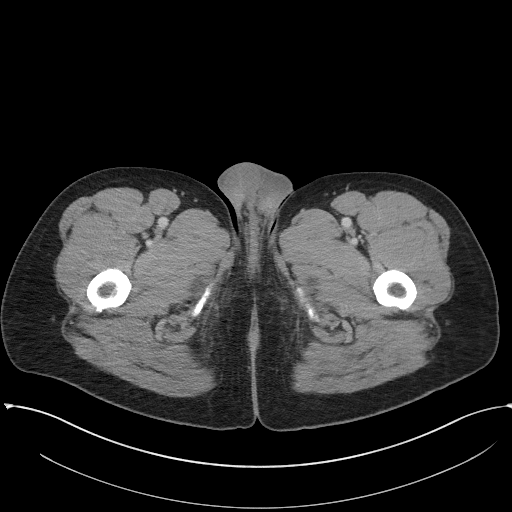
[im 6/103  bone]
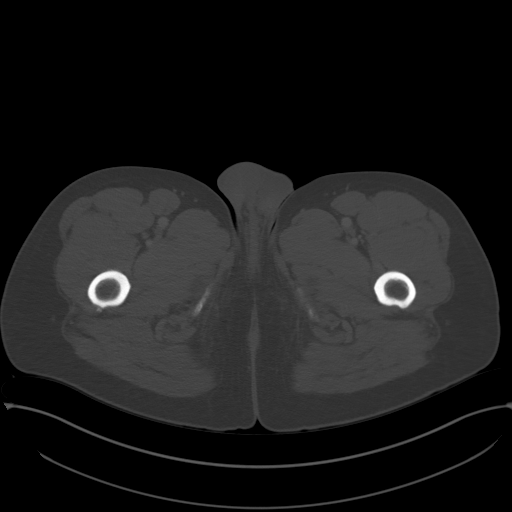
[im 16/103  soft-tissue]
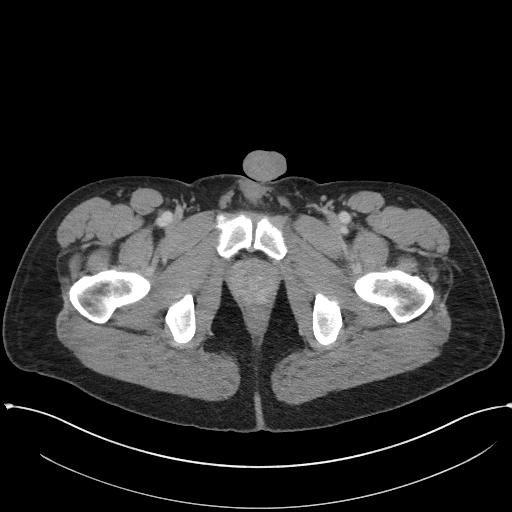
[im 21/103  soft-tissue]
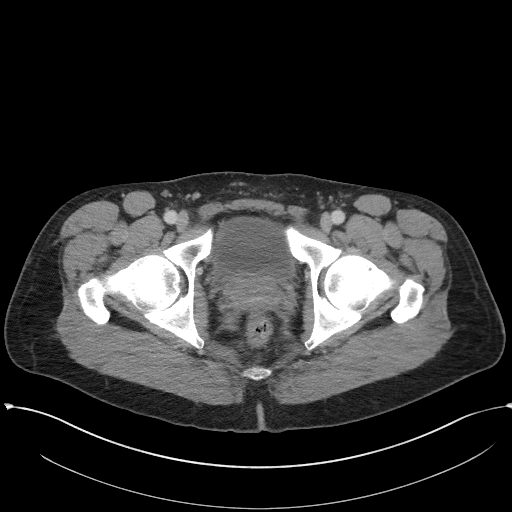
[im 31/103  soft-tissue]
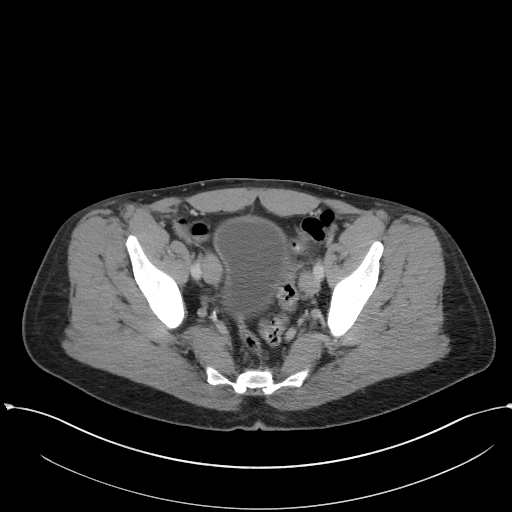
[im 36/103  soft-tissue]
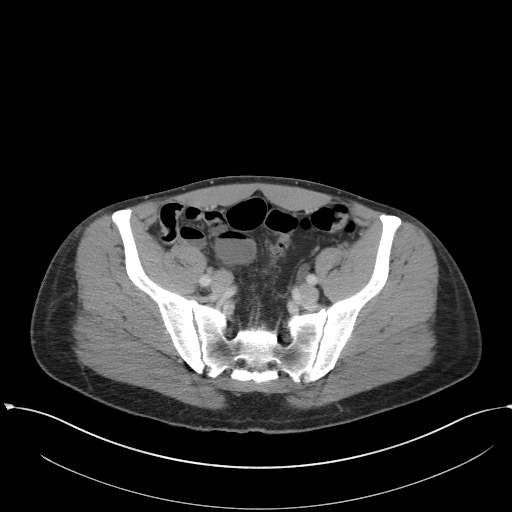
[im 46/103  soft-tissue]
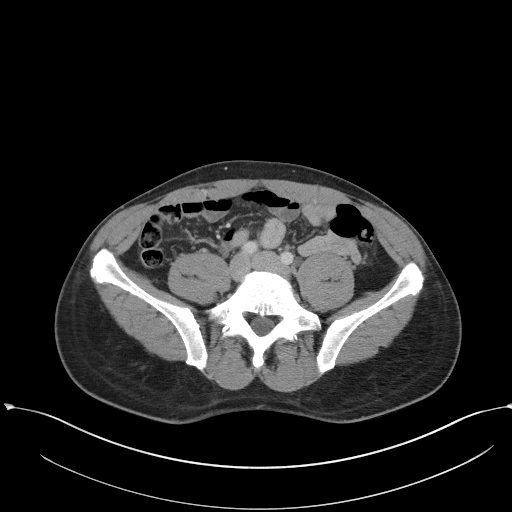
[im 52/103  soft-tissue]
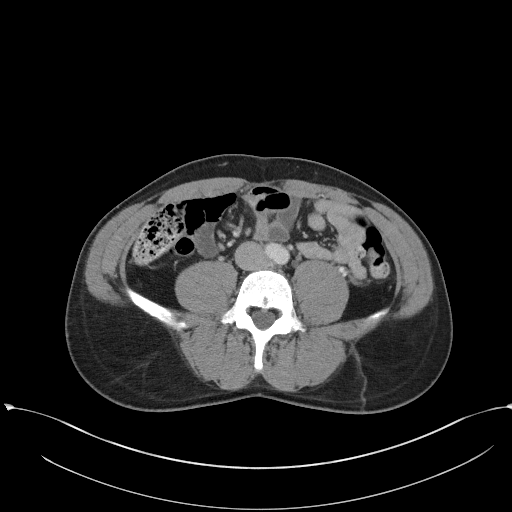
[im 57/103  soft-tissue]
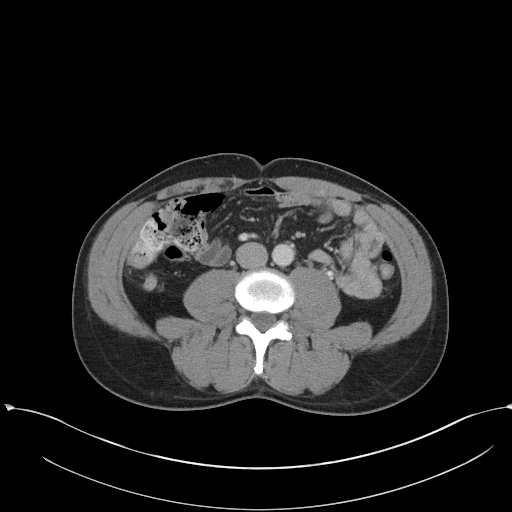
[im 67/103  soft-tissue]
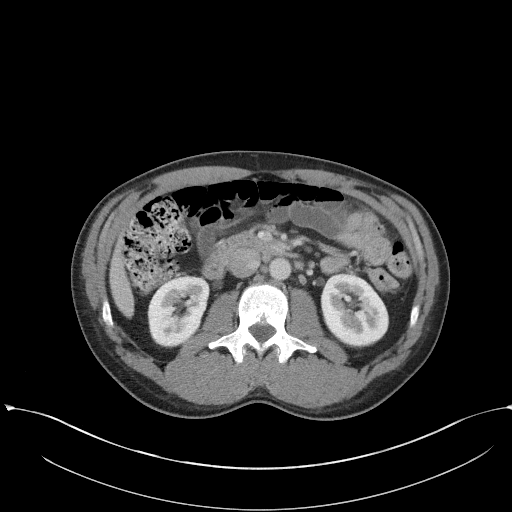
[im 67/103  bone]
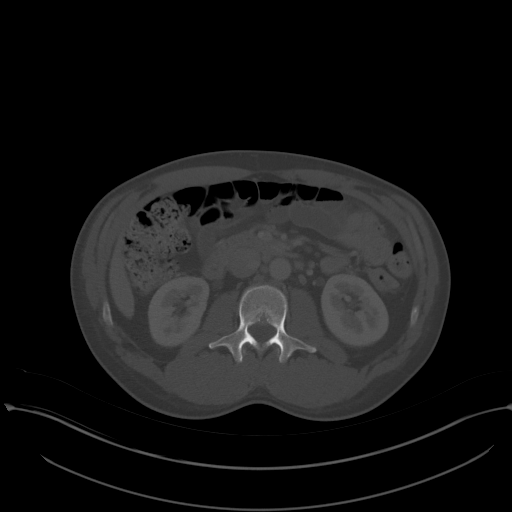
[im 72/103  soft-tissue]
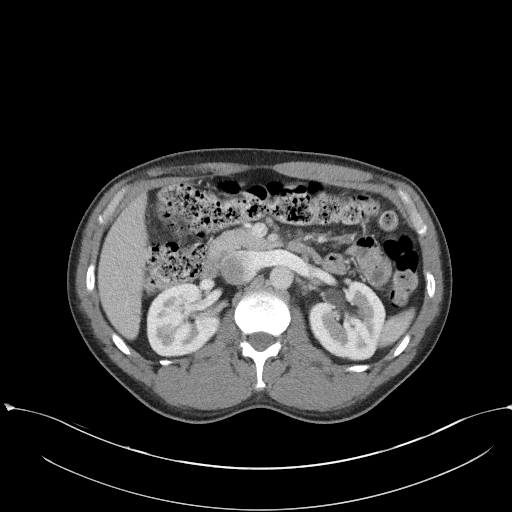
[im 82/103  soft-tissue]
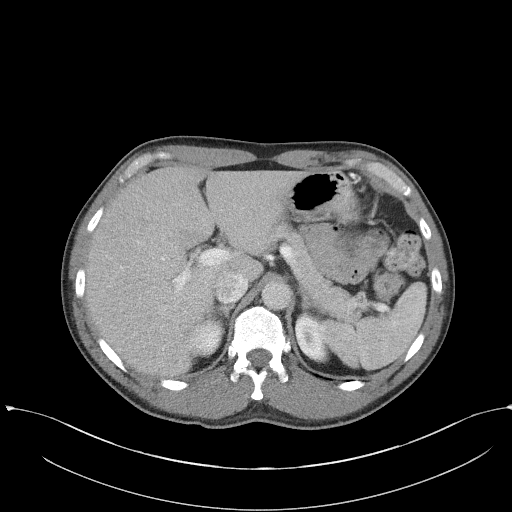
[im 87/103  soft-tissue]
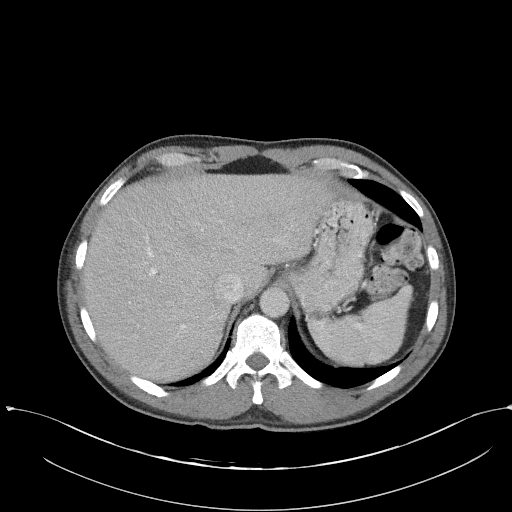
[im 97/103  soft-tissue]
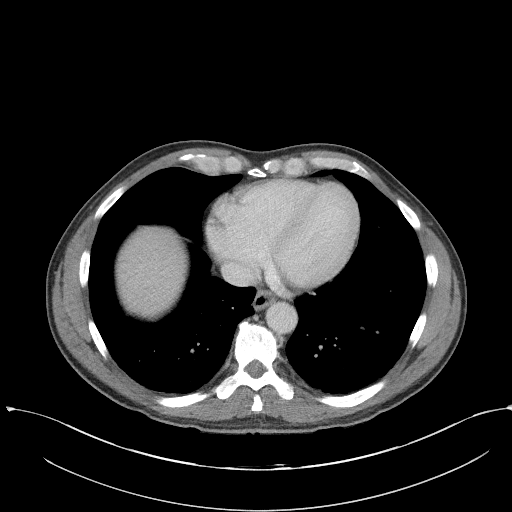

[Series 5: coronal st · coronal · 0.77mm/px · 3 of 78 slices shown]
[im 26/78  soft-tissue]
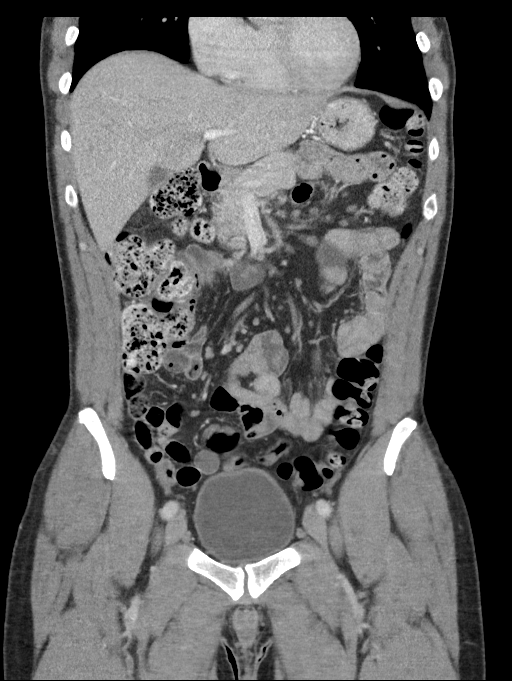
[im 35/78  soft-tissue]
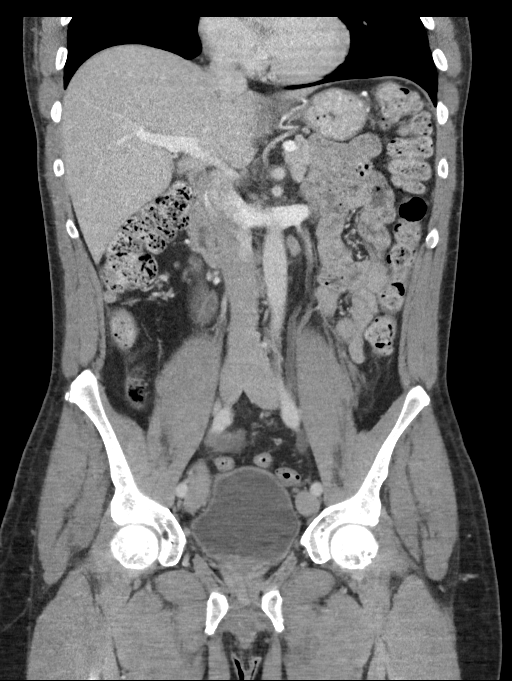
[im 43/78  soft-tissue]
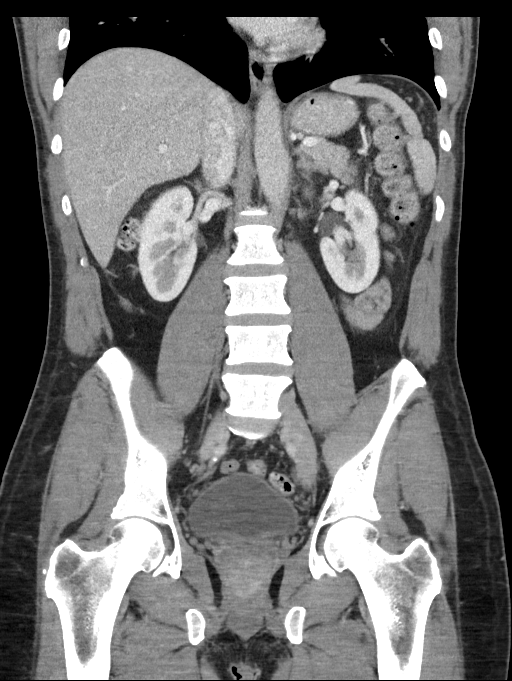

[16 of 46 positions shown; findings below may reference images not displayed]

FINDINGS: Lower chest: Minor dependent basilar atelectasis. Normal heart size.
No pericardial pleural effusion.

Hepatobiliary: No focal liver abnormality is seen. No gallstones,
gallbladder wall thickening, or biliary dilatation.

Pancreas: Unremarkable. No pancreatic ductal dilatation or
surrounding inflammatory changes.

Spleen: Normal in size without focal abnormality.

Adrenals/Urinary Tract: Adrenal glands are unremarkable. Kidneys are
normal, without renal calculi, focal lesion, or hydronephrosis.
Bladder is unremarkable.

Stomach/Bowel: Negative for bowel obstruction, significant
dilatation, ileus, or free air. Appendix is abnormal with mild
distension, mucosal enhancement and minor strandy edema. Findings
compatible with acute nonruptured appendicitis.

Appendix: Location: Retrocecal

Diameter: 10 mm

Appendicolith: None visualized

Mucosal hyper-enhancement: Present

Extraluminal gas: None visualized

Periappendiceal collection: None visualized

Vascular/Lymphatic: No significant vascular findings are present. No
enlarged abdominal or pelvic lymph nodes.

Reproductive: Prostate is unremarkable.

Other: No abdominal wall hernia or abnormality. No abdominopelvic
ascites.

Musculoskeletal: No acute or significant osseous findings.
IMPRESSION: Acute nonruptured appendicitis.

## 2019-12-31 ENCOUNTER — Other Ambulatory Visit: Payer: Self-pay

## 2019-12-31 ENCOUNTER — Ambulatory Visit: Payer: Commercial Managed Care - PPO | Attending: Family Medicine

## 2019-12-31 DIAGNOSIS — R252 Cramp and spasm: Secondary | ICD-10-CM | POA: Diagnosis present

## 2019-12-31 DIAGNOSIS — R293 Abnormal posture: Secondary | ICD-10-CM | POA: Insufficient documentation

## 2019-12-31 DIAGNOSIS — M542 Cervicalgia: Secondary | ICD-10-CM | POA: Insufficient documentation

## 2019-12-31 NOTE — Therapy (Signed)
[facility] [address], [place], [place], [address] Phone: [phone]   Fax:  [phone]  Physical Therapy Treatment  Patient Details  Name: Cody Wyatt MRN: [id number] Date of Birth: 05-Aug-1971 Referring Provider (PT): Alysia Penna, MD   Encounter Date: 12/31/2019  PT End of Session - 12/31/19 0816    Visit Number  6    Date for PT Re-Evaluation  12/31/19    Authorization Type  UMR    PT Start Time  0732    PT Stop Time  0801    PT Time Calculation (min)  29 min    Activity Tolerance  Patient tolerated treatment well    Behavior During Therapy  [facility] for tasks assessed/performed       Past Medical History:  Diagnosis Date  . Anal fissure   . Anxiety   . Hyperhydrosis disorder   . Insomnia   . Low back pain     Past Surgical History:  Procedure Laterality Date  . colonoscopy  05-10-09   per Dr. Deatra Ina, normal   . LAPAROSCOPIC APPENDECTOMY N/A 09/06/2018   Procedure: APPENDECTOMY LAPAROSCOPIC;  Surgeon: Armandina Gemma, MD;  Location: [facility];  Service: General;  Laterality: N/A;    There were no vitals filed for this visit.  Subjective Assessment - 12/31/19 0735    Subjective  Lapse in treatment due to travel.  I feel 75-85% better overall.    Currently in Pain?  Yes    Pain Score  3     Pain Location  Neck    Pain Orientation  Right;Left    Pain Descriptors / Indicators  Dull    Pain Type  Chronic pain    Pain Onset  More than a month ago    Pain Frequency  Constant    Aggravating Factors   use of phone, fairly constant.    Pain Relieving Factors  being active, topical rub         [facility] PT Assessment - 12/31/19 0001      Assessment   Medical Diagnosis  neck pain    Referring Provider (PT)  Alysia Penna, MD    Onset Date/Surgical Date  05/29/19    Hand Dominance  Right      Prior Function   Level of Independence  Independent      Observation/Other Assessments   Focus on Therapeutic  Outcomes (FOTO)   35% limitation      AROM   Cervical - Right Rotation  70    Cervical - Left Rotation  70                    [facility] Adult PT Treatment/Exercise - 12/31/19 0001      Manual Therapy   Manual Therapy  Joint mobilization;Soft tissue mobilization    Manual therapy comments  skilled palpation and monitoring of soft tissues during DN    Soft tissue mobilization  to bil cervical paraspinals, upper traps and paraspinals       Trigger Point Dry Needling - 12/31/19 0001    Consent Given?  Yes    Education Handout Provided  Previously provided    Muscles Treated Head and Neck  Cervical multifidi;Upper trapezius;Suboccipitals    Dry Needling Comments  bil    Upper Trapezius Response  Palpable increased muscle length;Twitch reponse elicited    Suboccipitals Response  Twitch response elicited;Palpable increased muscle length    Splenius capitus Response  Twitch reponse elicited;Palpable  increased muscle length    Cervical multifidi Response  Twitch reponse elicited;Palpable increased muscle length             PT Short Term Goals - 12/09/19 0806      PT SHORT TERM GOAL #2   Title  demonstrate 75 degrees of bil cervical A/ROM to improve safety with driving    Baseline  75 and 70    Status  On-going      PT SHORT TERM GOAL #3   Title  report > or = to 50% reduction in neck pain with turning head and upon waking in the morning    Status  Achieved        PT Long Term Goals - 12/31/19 0738      PT LONG TERM GOAL #1   Title  be independent in advanced HEP    Time  8    Period  Weeks    Status  On-going    Target Date  02/25/20      PT LONG TERM GOAL #2   Title  reduce FOTO to < or = to 27% limitation    Baseline  35% limitation    Time  8    Period  Weeks    Status  On-going    Target Date  02/25/20      PT LONG TERM GOAL #3   Title  demonstrate > or = to 80 degrees cervical A/ROM rotation to improve safety with driving    Baseline  70 degrees  bilaterally    Time  8    Period  Weeks    Status  On-going    Target Date  02/25/20      PT LONG TERM GOAL #4   Title  sit with neutral seated posture and report corrections and postion changes for long stretches at the computer    Status  Achieved      PT LONG TERM GOAL #5   Title  report a 90% reduction in neck pain with driving and upon waking in the morning    Baseline  50-80% depending on situation    Period  Weeks    Status  Revised    Target Date  02/25/20            Plan - 12/31/19 0808    Clinical Impression Statement  Pt reports 50-80% overall improvement in symptoms since the start of care. Pt has had limited attendance due to travel.  Pt has been consistent with HEP for postural strength and flexibility and no advancement is needed now.  Pt with improved cervical A/ROM that remains limited at end range rotation.  Pt with tension and trigger points in bil upper traps, suboccipitals and cervical multifidi. Pt demonstrated improve tissue mobility after manual and dry needling.  Pt will continue to benefit from skilled PT for manual therapy to address segmental and tissue mobility and advancement of HEP as needed.    Rehab Potential  Excellent    PT Frequency  Biweekly    PT Duration  8 weeks    PT Treatment/Interventions  ADLs/Self Care Home Management;Cryotherapy;Artist;Therapeutic activities;Therapeutic exercise;Neuromuscular re-education;Patient/family education;Manual techniques;Dry needling;Taping;Spinal Manipulations;Joint Manipulations    PT Next Visit Plan  continue dry needling and spinal mobs, advancement of HEP as needed.    PT Home Exercise Plan  Access Code: [email or web address]    Consulted and Agree with Plan of Care  Patient       Patient will benefit  from skilled therapeutic intervention in order to improve the following deficits and impairments:  Postural dysfunction, Improper body mechanics, Impaired flexibility,  Pain, Decreased activity tolerance, Increased muscle spasms, Decreased range of motion  Visit Diagnosis: Cervicalgia - Plan: PT plan of care cert/re-cert  Cramp and spasm - Plan: PT plan of care cert/re-cert  Abnormal posture - Plan: PT plan of care cert/re-cert     Problem List Patient Active Problem List   Diagnosis Date Noted  . Appendicitis, acute 09/06/2018  . Acute appendicitis 09/06/2018  . 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 FINGER SPRAIN 12/16/2009  . ACUTE MAXILLARY SINUSITIS 11/24/2009  . ACUTE BRONCHITIS 11/24/2009  . TRANSAMINASES, SERUM, ELEVATED 11/11/2009  . HEMORRHAGE OF RECTUM AND ANUS 05/09/2009  . DEPRESSION 06/23/2008  . LOW BACK PAIN 06/23/2008  . ANXIETY 02/19/2008  . LYMPHADENOPATHY, AXILLA 11/18/2007  . PRURITUS ANI 11/03/2007  . HYPERHIDROSIS 11/03/2007  . BACK PAIN, THORACIC REGION 09/25/2007  . RECTAL FISSURE 07/17/2007  . HEADACHE 04/29/2007  . CHICKENPOX, HX OF 03/19/2007    Sigurd Sos, PT 12/31/19 8:19 AM  [facility] [address], [place], [place], [address] Phone: [phone]   Fax:  [phone]  Name: JAKORY SPEDDING MRN: [id number] Date of Birth: [birth year]

## 2020-01-11 ENCOUNTER — Telehealth: Payer: Self-pay | Admitting: Family Medicine

## 2020-01-11 DIAGNOSIS — U071 COVID-19: Secondary | ICD-10-CM

## 2020-01-11 NOTE — Telephone Encounter (Signed)
Pt would like to get a COVID antibody test?  He stated he had COVID back in Late January/ Early February.  Pt is aware Dr. Sarajane Jews is out of office this week.  Pt can be reached at [phone]

## 2020-01-19 NOTE — Addendum Note (Signed)
Addended by: Alysia Penna A on: 01/19/2020 11:39 AM   Modules accepted: Orders

## 2020-01-19 NOTE — Telephone Encounter (Signed)
Patient is aware. He will call back for an appointment.

## 2020-01-19 NOTE — Telephone Encounter (Signed)
I ordered the test

## 2020-01-22 LAB — BASIC METABOLIC PANEL
BUN: 14 (ref 4–21)
Chloride: 96 — AB (ref 99–108)
Creatinine: 1 (ref 0.6–1.3)
Potassium: 4.3 (ref 3.4–5.3)

## 2020-01-22 LAB — LIPID PANEL
HDL: 110 — AB (ref 35–70)
LDL Cholesterol: 127
LDl/HDL Ratio: 2.2
Triglycerides: 62 (ref 40–160)

## 2020-01-22 LAB — HEPATIC FUNCTION PANEL
ALT: 22 (ref 10–40)
AST: 20 (ref 14–40)
Bilirubin, Direct: 0.18 (ref 0.01–0.4)
Bilirubin, Total: 0.5

## 2020-01-22 LAB — COMPREHENSIVE METABOLIC PANEL
Albumin: 4.5 (ref 3.5–5.0)
Calcium: 9.5 (ref 8.7–10.7)
Globulin: 2.6

## 2020-01-22 LAB — CBC AND DIFFERENTIAL
HCT: 46 (ref 41–53)
Hemoglobin: 16 (ref 13.5–17.5)
Platelets: 244 (ref 150–399)
WBC: 4.6

## 2020-01-22 LAB — CBC: RBC: 5.14 — AB (ref 3.87–5.11)

## 2020-01-22 LAB — HEMOGLOBIN A1C: Hemoglobin A1C: 5.1

## 2020-02-04 ENCOUNTER — Other Ambulatory Visit (INDEPENDENT_AMBULATORY_CARE_PROVIDER_SITE_OTHER): Payer: Commercial Managed Care - PPO

## 2020-02-04 ENCOUNTER — Other Ambulatory Visit: Payer: Self-pay

## 2020-02-04 DIAGNOSIS — U071 COVID-19: Secondary | ICD-10-CM

## 2020-02-04 LAB — SARS-COV-2 IGG: SARS-COV-2 IgG: 1.34

## 2020-02-10 ENCOUNTER — Ambulatory Visit: Payer: Commercial Managed Care - PPO | Attending: Family Medicine

## 2020-02-10 ENCOUNTER — Other Ambulatory Visit: Payer: Self-pay

## 2020-02-10 DIAGNOSIS — M542 Cervicalgia: Secondary | ICD-10-CM | POA: Diagnosis present

## 2020-02-10 DIAGNOSIS — R293 Abnormal posture: Secondary | ICD-10-CM

## 2020-02-10 DIAGNOSIS — R252 Cramp and spasm: Secondary | ICD-10-CM | POA: Insufficient documentation

## 2020-02-10 NOTE — Therapy (Signed)
[facility] [address], [place], [place], [address] Phone: [phone]   Fax:  [phone]  Physical Therapy Treatment  Patient Details  Name: Cody Wyatt MRN: [id number] Date of Birth: 1970-11-24 Referring Provider (PT): Alysia Penna, MD   Encounter Date: 02/10/2020   PT End of Session - 02/10/20 1013    Visit Number 7    Date for PT Re-Evaluation 12/31/19    Authorization Type UMR    PT Start Time 0931    PT Stop Time 1005    PT Time Calculation (min) 34 min    Activity Tolerance Patient tolerated treatment well    Behavior During Therapy [facility] for tasks assessed/performed           Past Medical History:  Diagnosis Date  . Anal fissure   . Anxiety   . Hyperhydrosis disorder   . Insomnia   . Low back pain     Past Surgical History:  Procedure Laterality Date  . colonoscopy  05-10-09   per Dr. Deatra Ina, normal   . LAPAROSCOPIC APPENDECTOMY N/A 09/06/2018   Procedure: APPENDECTOMY LAPAROSCOPIC;  Surgeon: Armandina Gemma, MD;  Location: [facility];  Service: General;  Laterality: N/A;    There were no vitals filed for this visit.   Subjective Assessment - 02/10/20 0934    Subjective This has really helped me.  90% better overall.    Currently in Pain? No/denies                             [facility]  Adult PT Treatment/Exercise - 02/10/20 0001      Manual Therapy   Manual Therapy Joint mobilization;Soft tissue mobilization    Manual therapy comments skilled palpation and monitoring of soft tissues during DN    Soft tissue mobilization to bil cervical paraspinals, upper traps and paraspinals            Trigger Point Dry Needling - 02/10/20 0001    Consent Given? Yes    Education Handout Provided Previously provided    Muscles Treated Head and Neck Cervical multifidi;Upper trapezius;Suboccipitals    Dry Needling Comments bil    Upper Trapezius Response Palpable increased muscle  length;Twitch reponse elicited    Suboccipitals Response Twitch response elicited;Palpable increased muscle length    Splenius capitus Response Twitch reponse elicited;Palpable increased muscle length    Cervical multifidi Response Twitch reponse elicited;Palpable increased muscle length                  PT Short Term Goals - 12/09/19 0806      PT SHORT TERM GOAL #2   Title demonstrate 75 degrees of bil cervical A/ROM to improve safety with driving    Baseline 75 and 70    Status On-going      PT SHORT TERM GOAL #3   Title report > or = to 50% reduction in neck pain with turning head and upon waking in the morning    Status Achieved             PT Long Term Goals - 12/31/19 3875      PT LONG TERM GOAL #1   Title be independent in advanced HEP    Time 8    Period Weeks    Status On-going    Target Date 02/25/20      PT LONG TERM GOAL #2   Title reduce FOTO to < or =  to 27% limitation    Baseline 35% limitation    Time 8    Period Weeks    Status On-going    Target Date 02/25/20      PT LONG TERM GOAL #3   Title demonstrate > or = to 80 degrees cervical A/ROM rotation to improve safety with driving    Baseline 70 degrees bilaterally    Time 8    Period Weeks    Status On-going    Target Date 02/25/20      PT LONG TERM GOAL #4   Title sit with neutral seated posture and report corrections and postion changes for long stretches at the computer    Status Achieved      PT LONG TERM GOAL #5   Title report a 90% reduction in neck pain with driving and upon waking in the morning    Baseline 50-80% depending on situation    Period Weeks    Status Revised    Target Date 02/25/20                 Plan - 02/10/20 1013    Clinical Impression Statement Pt has been doing well regarding symptoms and management of gains during a lapse in treatment.  Pt reports 90% overall improvement in symptoms and is consistent and compliant with HEP.  Session focused on  dry needling and manual therapy to bil neck to improve tissue mobility and cervical A/ROM.  Pt with improved tissue mobility overall and demonstrated improved tissue mobility after dry needling.  Pt will attend 1 more session for dry needling.    PT Frequency Biweekly    PT Treatment/Interventions ADLs/Self Care Home Management;Cryotherapy;Artist;Therapeutic activities;Therapeutic exercise;Neuromuscular re-education;Patient/family education;Manual techniques;Dry needling;Taping;Spinal Manipulations;Joint Manipulations    PT Next Visit Plan continue dry needling and spinal mobs, advancement of HEP as needed.  D/C and FOTO next session.    PT Home Exercise Plan Access Code: [email or web address]    Consulted and Agree with Plan of Care Patient           Patient will benefit from skilled therapeutic intervention in order to improve the following deficits and impairments:  Postural dysfunction, Improper body mechanics, Impaired flexibility, Pain, Decreased activity tolerance, Increased muscle spasms, Decreased range of motion  Visit Diagnosis: Cervicalgia  Cramp and spasm  Abnormal posture     Problem List Patient Active Problem List   Diagnosis Date Noted  . Appendicitis, acute 09/06/2018  . Acute appendicitis 09/06/2018  . 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 FINGER SPRAIN 12/16/2009  . ACUTE MAXILLARY SINUSITIS 11/24/2009  . ACUTE BRONCHITIS 11/24/2009  . TRANSAMINASES, SERUM, ELEVATED 11/11/2009  . HEMORRHAGE OF RECTUM AND ANUS 05/09/2009  . DEPRESSION 06/23/2008  . LOW BACK PAIN 06/23/2008  . ANXIETY 02/19/2008  . LYMPHADENOPATHY, AXILLA 11/18/2007  . PRURITUS ANI 11/03/2007  . HYPERHIDROSIS 11/03/2007  . BACK PAIN, THORACIC REGION 09/25/2007  . RECTAL FISSURE 07/17/2007  . HEADACHE 04/29/2007  . CHICKENPOX, HX OF 03/19/2007    Sigurd Sos, PT 02/10/20 10:14 AM  [facility] [address] [place], [place], [address] Phone: [phone]   Fax:  [phone]  Name: KENDELL SAGRAVES MRN: [id number] Date of Birth: 05-13-1971

## 2020-02-17 ENCOUNTER — Other Ambulatory Visit: Payer: Self-pay

## 2020-02-17 ENCOUNTER — Ambulatory Visit: Payer: Commercial Managed Care - PPO

## 2020-02-17 DIAGNOSIS — R252 Cramp and spasm: Secondary | ICD-10-CM

## 2020-02-17 DIAGNOSIS — R293 Abnormal posture: Secondary | ICD-10-CM

## 2020-02-17 DIAGNOSIS — M542 Cervicalgia: Secondary | ICD-10-CM | POA: Diagnosis not present

## 2020-02-17 NOTE — Therapy (Addendum)
[facility] [address], [place], [place], [address] Phone: [phone]   Fax:  [phone]  Physical Therapy Treatment  Patient Details  Name: Cody Wyatt MRN: [id number] Date of Birth: February 17, 1971 Referring Provider (PT): Alysia Penna, MD   Encounter Date: 02/17/2020   PT End of Session - 02/17/20 1012    Visit Number 8    Authorization Type UMR    PT Start Time 0932    PT Stop Time 1012    PT Time Calculation (min) 40 min    Activity Tolerance Patient tolerated treatment well    Behavior During Therapy [facility] for tasks assessed/performed           Past Medical History:  Diagnosis Date  . Anal fissure   . Anxiety   . Hyperhydrosis disorder   . Insomnia   . Low back pain     Past Surgical History:  Procedure Laterality Date  . colonoscopy  05-10-09   per Dr. Deatra Ina, normal   . LAPAROSCOPIC APPENDECTOMY N/A 09/06/2018   Procedure: APPENDECTOMY LAPAROSCOPIC;  Surgeon: Armandina Gemma, MD;  Location: [facility];  Service: General;  Laterality: N/A;    There were no vitals filed for this visit.   Subjective Assessment - 02/17/20 0936    Subjective 90% overall improvement in neck pain.    Currently in Pain? No/denies              [facility] PT Assessment - 02/17/20 0001      Assessment   Medical Diagnosis neck pain    Referring Provider (PT) Alysia Penna, MD    Onset Date/Surgical Date 05/29/19    Hand Dominance Right      Prior Function   Level of Independence Independent      Observation/Other Assessments   Focus on Therapeutic Outcomes (FOTO)  18% limitation      AROM   Cervical - Right Rotation 75    Cervical - Left Rotation 70                         [facility] Adult PT Treatment/Exercise - 02/17/20 0001      Manual Therapy   Manual Therapy Joint mobilization;Soft tissue mobilization    Manual therapy comments skilled palpation and monitoring of soft tissues during DN    Soft  tissue mobilization to bil cervical paraspinals, upper traps and paraspinals            Trigger Point Dry Needling - 02/17/20 0001    Consent Given? Yes    Education Handout Provided Previously provided    Muscles Treated Head and Neck Cervical multifidi;Upper trapezius;Suboccipitals    Dry Needling Comments bil    Upper Trapezius Response Palpable increased muscle length;Twitch reponse elicited    Suboccipitals Response Twitch response elicited;Palpable increased muscle length    Splenius capitus Response Twitch reponse elicited;Palpable increased muscle length    Cervical multifidi Response Twitch reponse elicited;Palpable increased muscle length                  PT Short Term Goals - 12/09/19 0806      PT SHORT TERM GOAL #2   Title demonstrate 75 degrees of bil cervical A/ROM to improve safety with driving    Baseline 75 and 70    Status On-going      PT SHORT TERM GOAL #3   Title report > or = to 50% reduction in neck pain with turning  head and upon waking in the morning    Status Achieved             PT Long Term Goals - 02/17/20 0936      PT LONG TERM GOAL #1   Title be independent in advanced HEP    Status Achieved      PT LONG TERM GOAL #2   Title reduce FOTO to < or = to 27% limitation    Baseline 18% limitation    Status Achieved      PT LONG TERM GOAL #3   Title demonstrate > or = to 80 degrees cervical A/ROM rotation to improve safety with driving    Baseline 70 degrees bilaterally    Status Partially Met      PT LONG TERM GOAL #4   Title sit with neutral seated posture and report corrections and postion changes for long stretches at the computer    Status Achieved      PT LONG TERM GOAL #5   Title report a 90% reduction in neck pain with driving and upon waking in the morning    Baseline 90%    Status Achieved                 Plan - 02/17/20 1014    Clinical Impression Statement Pt has been doing well regarding symptoms and  management of gains during a lapse in treatment.  Pt reports 90% overall improvement in symptoms and is consistent and compliant with HEP. FOTO is improved to 18% limitation and cervical rotation is improved.  Session focused on dry needling and manual therapy to bil neck to improve tissue mobility and cervical A/ROM.  Pt with improved tissue mobility overall and demonstrated improved tissue mobility after dry needling.  b    PT Next Visit Plan D/C PT    PT Home Exercise Plan Access Code: [email or web address]    Consulted and Agree with Plan of Care Patient           Patient will benefit from skilled therapeutic intervention in order to improve the following deficits and impairments:     Visit Diagnosis: Cervicalgia  Cramp and spasm  Abnormal posture     Problem List Patient Active Problem List   Diagnosis Date Noted  . Appendicitis, acute 09/06/2018  . Acute appendicitis 09/06/2018  . 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 FINGER SPRAIN 12/16/2009  . ACUTE MAXILLARY SINUSITIS 11/24/2009  . ACUTE BRONCHITIS 11/24/2009  . TRANSAMINASES, SERUM, ELEVATED 11/11/2009  . HEMORRHAGE OF RECTUM AND ANUS 05/09/2009  . DEPRESSION 06/23/2008  . LOW BACK PAIN 06/23/2008  . ANXIETY 02/19/2008  . LYMPHADENOPATHY, AXILLA 11/18/2007  . PRURITUS ANI 11/03/2007  . HYPERHIDROSIS 11/03/2007  . BACK PAIN, THORACIC REGION 09/25/2007  . RECTAL FISSURE 07/17/2007  . HEADACHE 04/29/2007  . CHICKENPOX, HX OF 03/19/2007     Cody Wyatt, PT 02/17/20 10:16 AM PHYSICAL THERAPY DISCHARGE SUMMARY  Visits from Start of Care: 8  Current functional level related to goals / functional outcomes: See above for current status.  Pt will D/C to HEP.     Remaining deficits: See above for most current goal status.     Education / Equipment: HEP, Biomedical scientist Plan: Patient agrees to discharge.  Patient goals were met. Patient is being discharged due to meeting the stated rehab goals.  ?????          Cody Wyatt, PT 03/29/20 8:49 AM    [facility]  [place] [address] [place], [place], [address] Phone: [phone]   Fax:  [phone]  Name: Cody Wyatt MRN: [id number] Date of Birth: 09-26-1970

## 2020-03-01 ENCOUNTER — Ambulatory Visit: Payer: Commercial Managed Care - PPO | Admitting: Physical Therapy

## 2020-03-03 ENCOUNTER — Telehealth: Payer: Self-pay | Admitting: Family Medicine

## 2020-03-03 NOTE — Telephone Encounter (Signed)
He is testing positive for COVID antibodies. He was wondering when or will he be able to get the vaccination. He just wanted to discuss what happens now that he has the antibodies.   Please Advise

## 2020-03-04 NOTE — Telephone Encounter (Signed)
I highly recommend he get the Covid vaccine because this will protect him from all the other Covid variants that are out there. He can get this any time now

## 2020-03-04 NOTE — Telephone Encounter (Signed)
Please advise 

## 2020-03-04 NOTE — Telephone Encounter (Signed)
Left a detailed message on verified voice mail informing him of Dr. Barbie Banner message below.

## 2020-04-05 ENCOUNTER — Other Ambulatory Visit: Payer: Self-pay | Admitting: Family Medicine

## 2020-04-06 NOTE — Telephone Encounter (Signed)
Last filled 09/23/2019 Last OV 10/21/2019  Ok to fill?

## 2020-04-29 ENCOUNTER — Telehealth: Payer: Self-pay | Admitting: Family Medicine

## 2020-04-29 MED ORDER — CLONAZEPAM 1 MG PO TABS: ORAL_TABLET | ORAL | 2 refills | Status: DC

## 2020-04-29 NOTE — Telephone Encounter (Signed)
Done

## 2020-04-29 NOTE — Telephone Encounter (Signed)
Pt is calling to see if he can get a refill on medication   clonazePAM (KLONOPIN) 1 MG tablet   [id number] [address] [place]  [place], [place] [address]  Phone:  [phone] Fax:  [phone]    Pt has an appointment on 09/28  Please advise

## 2020-04-29 NOTE — Telephone Encounter (Signed)
Last filled 01/21/2018 Last OV 10/21/2019  Ok to fill?

## 2020-05-17 ENCOUNTER — Other Ambulatory Visit: Payer: Self-pay

## 2020-05-17 ENCOUNTER — Encounter: Payer: Self-pay | Admitting: Family Medicine

## 2020-05-17 ENCOUNTER — Ambulatory Visit (INDEPENDENT_AMBULATORY_CARE_PROVIDER_SITE_OTHER): Payer: Commercial Managed Care - PPO | Admitting: Family Medicine

## 2020-05-17 VITALS — BP 138/78 | HR 55 | Temp 97.8°F | Ht 69.0 in | Wt 193.6 lb

## 2020-05-17 DIAGNOSIS — Z0000 Encounter for general adult medical examination without abnormal findings: Secondary | ICD-10-CM

## 2020-05-17 MED ORDER — OMEPRAZOLE 40 MG PO CPDR: DELAYED_RELEASE_CAPSULE | ORAL | 3 refills | Status: DC

## 2020-05-17 NOTE — Progress Notes (Signed)
   Subjective:    Patient ID: Cody Wyatt, male    DOB: 1971-03-19, 49 y.o.   MRN: [id number]  HPI Here for a well exam. He feels fine.    Review of Systems  Constitutional: Negative.   HENT: Negative.   Eyes: Negative.   Respiratory: Negative.   Cardiovascular: Negative.   Gastrointestinal: Negative.   Genitourinary: Negative.   Musculoskeletal: Negative.   Skin: Negative.   Neurological: Negative.   Psychiatric/Behavioral: Negative.        Objective:   Physical Exam Constitutional:      General: He is not in acute distress.    Appearance: He is well-developed. He is not diaphoretic.  HENT:     Head: Normocephalic and atraumatic.     Right Ear: External ear normal.     Left Ear: External ear normal.     Nose: Nose normal.     Mouth/Throat:     Pharynx: No oropharyngeal exudate.  Eyes:     General: No scleral icterus.       Right eye: No discharge.        Left eye: No discharge.     Conjunctiva/sclera: Conjunctivae normal.     Pupils: Pupils are equal, round, and reactive to light.  Neck:     Thyroid: No thyromegaly.     Vascular: No JVD.     Trachea: No tracheal deviation.  Cardiovascular:     Rate and Rhythm: Normal rate and regular rhythm.     Heart sounds: Normal heart sounds. No murmur heard.  No friction rub. No gallop.   Pulmonary:     Effort: Pulmonary effort is normal. No respiratory distress.     Breath sounds: Normal breath sounds. No wheezing or rales.  Chest:     Chest wall: No tenderness.  Abdominal:     General: Bowel sounds are normal. There is no distension.     Palpations: Abdomen is soft. There is no mass.     Tenderness: There is no abdominal tenderness. There is no guarding or rebound.  Genitourinary:    Penis: Normal. No tenderness.      Testes: Normal.     Prostate: Normal.     Rectum: Normal. Guaiac result negative.  Musculoskeletal:        General: No tenderness. Normal range of motion.     Cervical back: Neck supple.   Lymphadenopathy:     Cervical: No cervical adenopathy.  Skin:    General: Skin is warm and dry.     Coloration: Skin is not pale.     Findings: No erythema or rash.  Neurological:     Mental Status: He is alert and oriented to person, place, and time.     Cranial Nerves: No cranial nerve deficit.     Motor: No abnormal muscle tone.     Coordination: Coordination normal.     Deep Tendon Reflexes: Reflexes are normal and symmetric. Reflexes normal.  Psychiatric:        Behavior: Behavior normal.        Thought Content: Thought content normal.        Judgment: Judgment normal.           Assessment & Plan:  Well exam. We discussed diet and exercise. Get fasting labs. Alysia Penna, MD

## 2020-05-18 LAB — BASIC METABOLIC PANEL
BUN: 12 mg/dL (ref 7–25)
CO2: 25 mmol/L (ref 20–32)
Calcium: 9.4 mg/dL (ref 8.6–10.3)
Chloride: 97 mmol/L — ABNORMAL LOW (ref 98–110)
Creat: 0.86 mg/dL (ref 0.70–1.33)
Glucose, Bld: 96 mg/dL (ref 65–99)
Potassium: 3.5 mmol/L (ref 3.5–5.3)
Sodium: 133 mmol/L — ABNORMAL LOW (ref 135–146)

## 2020-05-18 LAB — CBC WITH DIFFERENTIAL/PLATELET
Absolute Monocytes: 509 cells/uL (ref 200–950)
Basophils Absolute: 48 cells/uL (ref 0–200)
Basophils Relative: 0.9 %
Eosinophils Absolute: 318 cells/uL (ref 15–500)
Eosinophils Relative: 6 %
HCT: 46 % (ref 38.5–50.0)
Hemoglobin: 15.4 g/dL (ref 13.2–17.1)
Lymphs Abs: 1717 cells/uL (ref 850–3900)
MCH: 30.2 pg (ref 27.0–33.0)
MCHC: 33.5 g/dL (ref 32.0–36.0)
MCV: 90.2 fL (ref 80.0–100.0)
MPV: 11.7 fL (ref 7.5–12.5)
Monocytes Relative: 9.6 %
Neutro Abs: 2708 cells/uL (ref 1500–7800)
Neutrophils Relative %: 51.1 %
Platelets: 222 10*3/uL (ref 140–400)
RBC: 5.1 10*6/uL (ref 4.20–5.80)
RDW: 12.6 % (ref 11.0–15.0)
Total Lymphocyte: 32.4 %
WBC: 5.3 10*3/uL (ref 3.8–10.8)

## 2020-05-18 LAB — HEPATIC FUNCTION PANEL
AG Ratio: 1.7 (calc) (ref 1.0–2.5)
ALT: 17 U/L (ref 9–46)
AST: 21 U/L (ref 10–35)
Albumin: 4.5 g/dL (ref 3.6–5.1)
Alkaline phosphatase (APISO): 36 U/L (ref 35–144)
Bilirubin, Direct: 0.2 mg/dL (ref 0.0–0.2)
Globulin: 2.6 g/dL (calc) (ref 1.9–3.7)
Indirect Bilirubin: 0.7 mg/dL (calc) (ref 0.2–1.2)
Total Bilirubin: 0.9 mg/dL (ref 0.2–1.2)
Total Protein: 7.1 g/dL (ref 6.1–8.1)

## 2020-05-18 LAB — LIPID PANEL
Cholesterol: 232 mg/dL — ABNORMAL HIGH (ref <200)
HDL: 99 mg/dL (ref >=40)
LDL Cholesterol (Calc): 118 mg/dL (calc) — ABNORMAL HIGH
Non-HDL Cholesterol (Calc): 133 mg/dL (calc) — ABNORMAL HIGH (ref <130)
Total CHOL/HDL Ratio: 2.3 (calc) (ref <5.0)
Triglycerides: 62 mg/dL (ref <150)

## 2020-05-18 LAB — TSH: TSH: 2.12 mIU/L (ref 0.40–4.50)

## 2020-05-18 LAB — PSA: PSA: 0.94 ng/mL (ref <=4.0)

## 2020-07-27 ENCOUNTER — Ambulatory Visit (INDEPENDENT_AMBULATORY_CARE_PROVIDER_SITE_OTHER): Payer: Commercial Managed Care - PPO | Admitting: Family Medicine

## 2020-07-27 ENCOUNTER — Other Ambulatory Visit: Payer: Self-pay

## 2020-07-27 ENCOUNTER — Encounter: Payer: Self-pay | Admitting: Family Medicine

## 2020-07-27 VITALS — BP 122/84 | HR 56 | Temp 97.8°F | Ht 69.0 in | Wt 193.0 lb

## 2020-07-27 DIAGNOSIS — K219 Gastro-esophageal reflux disease without esophagitis: Secondary | ICD-10-CM | POA: Diagnosis not present

## 2020-07-27 NOTE — Progress Notes (Signed)
   Subjective:    Patient ID: Cody Wyatt, male    DOB: 1971-08-03, 49 y.o.   MRN: [id number]  HPI Here for several months of frequent tightness in the chest and very slight SOB. He has had frequent heartburn as well, but no trouble swallowing. He has Omeprazole at home but he seldom takes it. He has been under a lot of job stress, and he thinks these symptoms may be stress related.    Review of Systems  Constitutional: Negative.   Respiratory: Positive for chest tightness and shortness of breath. Negative for cough, choking and wheezing.   Cardiovascular: Negative.   Psychiatric/Behavioral: Negative for dysphoric mood. The patient is nervous/anxious.        Objective:   Physical Exam Constitutional:      General: He is not in acute distress.    Appearance: He is well-developed.  Cardiovascular:     Rate and Rhythm: Normal rate and regular rhythm.     Pulses: Normal pulses.     Heart sounds: Normal heart sounds.  Pulmonary:     Effort: Pulmonary effort is normal.     Breath sounds: Normal breath sounds.  Neurological:     General: No focal deficit present.     Mental Status: He is alert and oriented to person, place, and time.  Psychiatric:        Mood and Affect: Mood normal.        Thought Content: Thought content normal.        Judgment: Judgment normal.           Assessment & Plan:  Chest tightness, likely related to GERD and esophageal spasm. I advised him to take the Omeprazole every day first thing in the mornings. If this does not help, we may consider trying him on a daily anxiety medication. Alysia Penna, MD

## 2020-11-09 ENCOUNTER — Other Ambulatory Visit: Payer: Self-pay | Admitting: Family Medicine

## 2020-11-09 NOTE — Telephone Encounter (Signed)
Last office visit-12/02021 Last refill- 04/29/2020--60 tabs with 2 refills  Next office visit not scheduled

## 2020-12-06 ENCOUNTER — Other Ambulatory Visit: Payer: Self-pay | Admitting: Family Medicine

## 2020-12-06 NOTE — Telephone Encounter (Signed)
Pt LOV 07/27/2020  Last refill was done 04/06/2020 for 60 tablets with 5 refill Please advise

## 2021-01-24 ENCOUNTER — Other Ambulatory Visit: Payer: Self-pay

## 2021-01-25 ENCOUNTER — Ambulatory Visit (INDEPENDENT_AMBULATORY_CARE_PROVIDER_SITE_OTHER): Payer: Commercial Managed Care - PPO | Admitting: Family Medicine

## 2021-01-25 ENCOUNTER — Encounter: Payer: Self-pay | Admitting: Family Medicine

## 2021-01-25 VITALS — BP 120/82 | HR 67 | Ht 69.0 in | Wt 191.2 lb

## 2021-01-25 DIAGNOSIS — Z0000 Encounter for general adult medical examination without abnormal findings: Secondary | ICD-10-CM | POA: Diagnosis not present

## 2021-01-25 LAB — LIPID PANEL
Cholesterol: 255 mg/dL — ABNORMAL HIGH (ref 0–200)
HDL: 113.8 mg/dL (ref >39.00)
LDL Cholesterol: 129 mg/dL — ABNORMAL HIGH (ref 0–99)
NonHDL: 141.59
Total CHOL/HDL Ratio: 2
Triglycerides: 65 mg/dL (ref 0.0–149.0)
VLDL: 13 mg/dL (ref 0.0–40.0)

## 2021-01-25 LAB — BASIC METABOLIC PANEL
BUN: 18 mg/dL (ref 6–23)
CO2: 28 mEq/L (ref 19–32)
Calcium: 9.7 mg/dL (ref 8.4–10.5)
Chloride: 96 mEq/L (ref 96–112)
Creatinine, Ser: 0.98 mg/dL (ref 0.40–1.50)
GFR: 89.26 mL/min (ref >60.00)
Glucose, Bld: 93 mg/dL (ref 70–99)
Potassium: 3.9 mEq/L (ref 3.5–5.1)
Sodium: 134 mEq/L — ABNORMAL LOW (ref 135–145)

## 2021-01-25 LAB — PSA: PSA: 1.07 ng/mL (ref 0.10–4.00)

## 2021-01-25 NOTE — Progress Notes (Signed)
Subjective:    Patient ID: Cody Wyatt, male    DOB: [birth year], 50 y.o.   MRN: [id number]  HPI Here for a well exam. He feels fine. He had some labs done through his job in May which were notable for HDL of 106, LDL of 154, and sodium of 130.    Review of Systems  Constitutional: Negative.   HENT: Negative.   Eyes: Negative.   Respiratory: Negative.   Cardiovascular: Negative.   Gastrointestinal: Negative.   Genitourinary: Negative.   Musculoskeletal: Negative.   Skin: Negative.   Neurological: Negative.   Psychiatric/Behavioral: Negative.        Objective:   Physical Exam Constitutional:      General: He is not in acute distress.    Appearance: Normal appearance. He is well-developed. He is not diaphoretic.  HENT:     Head: Normocephalic and atraumatic.     Right Ear: External ear normal.     Left Ear: External ear normal.     Nose: Nose normal.     Mouth/Throat:     Pharynx: No oropharyngeal exudate.  Eyes:     General: No scleral icterus.       Right eye: No discharge.        Left eye: No discharge.     Conjunctiva/sclera: Conjunctivae normal.     Pupils: Pupils are equal, round, and reactive to light.  Neck:     Thyroid: No thyromegaly.     Vascular: No JVD.     Trachea: No tracheal deviation.  Cardiovascular:     Rate and Rhythm: Normal rate and regular rhythm.     Heart sounds: Normal heart sounds. No murmur heard. No friction rub. No gallop.   Pulmonary:     Effort: Pulmonary effort is normal. No respiratory distress.     Breath sounds: Normal breath sounds. No wheezing or rales.  Chest:     Chest wall: No tenderness.  Abdominal:     General: Bowel sounds are normal. There is no distension.     Palpations: Abdomen is soft. There is no mass.     Tenderness: There is no abdominal tenderness. There is no guarding or rebound.  Genitourinary:    Penis: Normal. No tenderness.      Testes: Normal.     Prostate: Normal.     Rectum: Normal. Guaiac  result negative.  Musculoskeletal:        General: No tenderness. Normal range of motion.     Cervical back: Neck supple.  Lymphadenopathy:     Cervical: No cervical adenopathy.  Skin:    General: Skin is warm and dry.     Coloration: Skin is not pale.     Findings: No erythema or rash.  Neurological:     Mental Status: He is alert and oriented to person, place, and time.     Cranial Nerves: No cranial nerve deficit.     Motor: No abnormal muscle tone.     Coordination: Coordination normal.     Deep Tendon Reflexes: Reflexes are normal and symmetric. Reflexes normal.  Psychiatric:        Behavior: Behavior normal.        Thought Content: Thought content normal.        Judgment: Judgment normal.           Assessment & Plan:  Well exam. We discussed diet and exercise. He is fasting so we will repeat lipids and BMET to recheck the abnormalities  above. Also check a PSA. Alysia Penna, MD

## 2021-04-13 ENCOUNTER — Other Ambulatory Visit: Payer: Self-pay | Admitting: Family Medicine

## 2021-05-17 ENCOUNTER — Other Ambulatory Visit: Payer: Self-pay | Admitting: Family Medicine

## 2021-07-24 ENCOUNTER — Other Ambulatory Visit: Payer: Self-pay | Admitting: Family Medicine

## 2021-08-01 ENCOUNTER — Telehealth: Payer: Self-pay

## 2021-08-01 NOTE — Telephone Encounter (Signed)
--  caller states he currently has right sided shoulder pain radiating into the chest no previous cardiac history described as tightness of chest.  07/31/2021 4:44:29 PM 911 Outcome Documentation Ahorlu, RN, Shellia Cleverly Reason: caller states he is driving and in [place] will not call 911 advised to seek immediate medical attention verbalized understanding  07/31/2021 4:42:13 PM Call EMS 911 Now Ahorlu, RN, [employer] disagrees with determination.  08/01/21 1156: Pt is still in [place]. States pain is not concerning & would like Dr Sarajane Jews advice on what to do. He denies SOB, ran 10mi on Sat without difficulty. States this pain in ongoing x 2weeks. Believes it's heartburn related as he has had this problem in the past. Describes pain as dull constant pain that he rates as 1 on 0-10 pain scale. Appt scheduled with PCP on 08/07/21.

## 2021-08-07 ENCOUNTER — Encounter: Payer: Self-pay | Admitting: Family Medicine

## 2021-08-07 ENCOUNTER — Ambulatory Visit: Payer: Commercial Managed Care - PPO | Admitting: Family Medicine

## 2021-08-07 VITALS — BP 128/76 | HR 68 | Temp 98.5°F | Wt 191.0 lb

## 2021-08-07 DIAGNOSIS — Z23 Encounter for immunization: Secondary | ICD-10-CM | POA: Diagnosis not present

## 2021-08-07 DIAGNOSIS — K219 Gastro-esophageal reflux disease without esophagitis: Secondary | ICD-10-CM

## 2021-08-07 MED ORDER — FAMOTIDINE 40 MG PO TABS: 40.0000 mg | ORAL_TABLET | Freq: Every day | ORAL | 3 refills | Status: DC

## 2021-08-07 NOTE — Progress Notes (Signed)
° °  Subjective:    Patient ID: [place] Wyatt, male    DOB: [birth year], 50 y.o.   MRN: [id number]  HPI Here for about a month of occasional heartburn in the evenings or during the night. He does well in the mornings. He takes Omeprazole 40 mg every morning. No SOB.    Review of Systems  Constitutional: Negative.   Respiratory: Negative.    Cardiovascular: Negative.   Gastrointestinal:  Negative for constipation, diarrhea, nausea and vomiting.      Objective:   Physical Exam Constitutional:      Appearance: Normal appearance.  Cardiovascular:     Rate and Rhythm: Normal rate and regular rhythm.     Pulses: Normal pulses.     Heart sounds: Normal heart sounds.  Pulmonary:     Effort: Pulmonary effort is normal.     Breath sounds: Normal breath sounds.  Neurological:     Mental Status: He is alert.          Assessment & Plan:  He is having breakthrough GERD symptoms. He will stay on Omeprazole in the mornings and we will add Pepcid 40 mg every evening. Recheck as needed.  Alysia Penna, MD

## 2021-08-07 NOTE — Addendum Note (Signed)
Addended by: Wyvonne Lenz on: 08/07/2021 04:21 PM   Modules accepted: Orders

## 2021-10-07 ENCOUNTER — Other Ambulatory Visit: Payer: Self-pay | Admitting: Family Medicine

## 2021-12-05 ENCOUNTER — Telehealth (INDEPENDENT_AMBULATORY_CARE_PROVIDER_SITE_OTHER): Payer: Commercial Managed Care - PPO | Admitting: Family Medicine

## 2021-12-05 ENCOUNTER — Encounter: Payer: Self-pay | Admitting: Family Medicine

## 2021-12-05 DIAGNOSIS — J020 Streptococcal pharyngitis: Secondary | ICD-10-CM

## 2021-12-05 DIAGNOSIS — U071 COVID-19: Secondary | ICD-10-CM | POA: Diagnosis not present

## 2021-12-05 MED ORDER — AZITHROMYCIN 250 MG PO TABS: ORAL_TABLET | ORAL | 0 refills | Status: DC

## 2021-12-05 NOTE — Progress Notes (Signed)
? ?Subjective:  ? ? Patient ID: Cody Wyatt, male    DOB: Nov 13, 1970, 51 y.o.   MRN: [id number] ? ?HPI ?Virtual Visit via Video Note ? ?I connected with the patient on 12/05/21 at 10:45 AM EDT by a video enabled telemedicine application and verified that I am speaking with the correct person using two identifiers. ? Location patient: home ?Location provider:work or home office ?Persons participating in the virtual visit: patient, provider ? ?I discussed the limitations of evaluation and management by telemedicine and the availability of in person appointments. The patient expressed understanding and agreed to proceed. ? ? ?HPI: ?Here for 3 days of stuffy head, ST, body aches, and a dry cough. No SOB or fever. No NVD. He tested positive for the Covid-19 virus this morning. Of note he and his family jsut got back from a trip to [place] last week, and his daughter also got sick. She saw her pediatrician 2 days ago, and she tested positive for strep. She is being treated with a Zpack, and she has responded quickly to this.  ? ? ?ROS: See pertinent positives and negatives per HPI. ? ?Past Medical History:  ?Diagnosis Date  ? Anal fissure   ? Anxiety   ? Hyperhydrosis disorder   ? Insomnia   ? Low back pain   ? ? ?Past Surgical History:  ?Procedure Laterality Date  ? COLONOSCOPY  03/20/2019  ? per Dr. Loletha Carrow, clear, repeat in 10 yrs   ? ESOPHAGOGASTRODUODENOSCOPY  03/20/2019  ? per Dr. Loletha Carrow, gastritis   ? LAPAROSCOPIC APPENDECTOMY N/A 09/06/2018  ? Procedure: APPENDECTOMY LAPAROSCOPIC;  Surgeon: Armandina Gemma, MD;  Location: [facility];  Service: General;  Laterality: N/A;  ? ? ?Family History  ?Problem Relation Age of Onset  ? Colon polyps Father   ? Leukemia Daughter   ? AAA (abdominal aortic aneurysm) Neg Hx   ? ? ? ?Current Outpatient Medications:  ?  clonazePAM (KLONOPIN) 1 MG tablet, TAKE 1 TABLET BY MOUTH TWICE A DAY AS NEEDED, Disp: 60 tablet, Rfl: 5 ?  famotidine (PEPCID) 40 MG tablet, Take 1 tablet (40 mg total) by  mouth daily., Disp: 90 tablet, Rfl: 3 ?  hydrocortisone-pramoxine (ANALPRAM-HC) 2.5-1 % rectal cream, INSERT 1 APPLICATORFUL RECTALLY 3 TIMES A DAY, Disp: 30 g, Rfl: 6 ?  omeprazole (PRILOSEC) 40 MG capsule, TAKE 1 CAPSULE BY MOUTH EVERY DAY, Disp: 90 capsule, Rfl: 1 ?  temazepam (RESTORIL) 15 MG capsule, TAKE 2 CAPSULES BY MOUTH AT BEDTIME AS NEEDED, Disp: 60 capsule, Rfl: 5 ? ?EXAM: ? ?VITALS per patient if applicable: ? ?GENERAL: alert, oriented, appears well and in no acute distress ? ?HEENT: atraumatic, conjunttiva clear, no obvious abnormalities on inspection of external nose and ears ? ?NECK: normal movements of the head and neck ? ?LUNGS: on inspection no signs of respiratory distress, breathing rate appears normal, no obvious gross SOB, gasping or wheezing ? ?CV: no obvious cyanosis ? ?MS: moves all visible extremities without noticeable abnormality ? ?PSYCH/NEURO: pleasant and cooperative, no obvious depression or anxiety, speech and thought processing grossly intact ? ?ASSESSMENT AND PLAN: ?Even though Bevan has tested positive for Covid, I think his symptoms are most likely coming from a strep infection. We will treat this with a Zpack. Recheck as needed.  ?Alysia Penna, MD ? ?Discussed the following assessment and plan: ? ?No diagnosis found. ? ? ?  ?I discussed the assessment and treatment plan with the patient. The patient was provided an opportunity to ask questions and  all were answered. The patient agreed with the plan and demonstrated an understanding of the instructions. ?  ?The patient was advised to call back or seek an in-person evaluation if the symptoms worsen or if the condition fails to improve as anticipated. ? ?  ? ? ?Review of Systems ? ?   ?Objective:  ? Physical Exam ? ? ? ? ?   ?Assessment & Plan:  ? ? ?

## 2021-12-25 ENCOUNTER — Encounter: Payer: Self-pay | Admitting: Family Medicine

## 2021-12-25 ENCOUNTER — Ambulatory Visit: Payer: Commercial Managed Care - PPO | Admitting: Family Medicine

## 2021-12-25 VITALS — BP 126/80 | HR 63 | Temp 98.2°F | Wt 180.0 lb

## 2021-12-25 DIAGNOSIS — M7021 Olecranon bursitis, right elbow: Secondary | ICD-10-CM | POA: Diagnosis not present

## 2021-12-25 MED ORDER — TEMAZEPAM 15 MG PO CAPS: 30.0000 mg | ORAL_CAPSULE | Freq: Every evening | ORAL | 5 refills | Status: DC | PRN

## 2021-12-25 NOTE — Progress Notes (Signed)
? ?  Subjective:  ? ? Patient ID: JEREMI LOSITO, male    DOB: [birth year], 51 y.o.   MRN: [id number] ? ?HPI ?Here for one week of soreness and swelling over the right elbow. No recent trauma. He has not done anything for it so far . ? ? ?Review of Systems  ?Constitutional: Negative.   ?Respiratory: Negative.    ?Cardiovascular: Negative.   ?Musculoskeletal:  Positive for arthralgias.  ? ?   ?Objective:  ? Physical Exam ?Constitutional:   ?   General: He is not in acute distress. ?   Appearance: Normal appearance.  ?Cardiovascular:  ?   Rate and Rhythm: Normal rate and regular rhythm.  ?   Pulses: Normal pulses.  ?   Heart sounds: Normal heart sounds.  ?Pulmonary:  ?   Effort: Pulmonary effort is normal.  ?   Breath sounds: Normal breath sounds.  ?Musculoskeletal:  ?   Comments: He is mildly swollen and tender over the left olecranon. No erythema or warmth. Full ROM   ?Neurological:  ?   Mental Status: He is alert.  ? ? ? ? ? ?   ?Assessment & Plan:  ?Olecranon bursitis. Rest, apply ice packs.  He may take 800 mg of Ibuprofen 3-4 times a day as needed. ?Alysia Penna, MD ? ? ?

## 2022-03-26 ENCOUNTER — Encounter: Payer: Self-pay | Admitting: Family Medicine

## 2022-03-26 ENCOUNTER — Ambulatory Visit: Payer: Commercial Managed Care - PPO | Admitting: Family Medicine

## 2022-03-26 VITALS — BP 112/80 | HR 66 | Temp 98.6°F | Wt 181.4 lb

## 2022-03-26 DIAGNOSIS — R030 Elevated blood-pressure reading, without diagnosis of hypertension: Secondary | ICD-10-CM | POA: Diagnosis not present

## 2022-03-26 DIAGNOSIS — Z8249 Family history of ischemic heart disease and other diseases of the circulatory system: Secondary | ICD-10-CM | POA: Diagnosis not present

## 2022-03-26 NOTE — Progress Notes (Signed)
   Subjective:    Patient ID: Cody Wyatt, male    DOB: [birth year], 51 y.o.   MRN: [id number]  HPI Here for elevated BP readings he has been getting at home the past week. He feels fine. He has been using a 53 year old BP arm cuff at home. Lately his readings have been in the 150s over 90s. He also asks about a cardiac calcium score to screen his heart. He runs 20 miles a week, and he never has to stop due to chest pain or SOB.    Review of Systems  Constitutional: Negative.   Respiratory: Negative.    Cardiovascular: Negative.        Objective:   Physical Exam Constitutional:      Appearance: Normal appearance. He is not ill-appearing.  Cardiovascular:     Rate and Rhythm: Normal rate and regular rhythm.     Pulses: Normal pulses.     Heart sounds: Normal heart sounds.  Pulmonary:     Effort: Pulmonary effort is normal.     Breath sounds: Normal breath sounds.  Neurological:     General: No focal deficit present.     Mental Status: He is alert and oriented to person, place, and time.           Assessment & Plan:  It seems likely that his BP is actually well controlled, but his cuff is not the correct size. He will purchase a newer cuff and follow his readings for one week. He will then report back to [place]. We will also set up a cardiac calcium scoring exam soon.  Alysia Penna, MD

## 2022-04-10 ENCOUNTER — Encounter: Payer: Self-pay | Admitting: Family Medicine

## 2022-04-10 ENCOUNTER — Ambulatory Visit (INDEPENDENT_AMBULATORY_CARE_PROVIDER_SITE_OTHER): Payer: Commercial Managed Care - PPO | Admitting: Family Medicine

## 2022-04-10 VITALS — BP 128/80 | HR 59 | Ht 68.25 in | Wt 183.0 lb

## 2022-04-10 DIAGNOSIS — Z0000 Encounter for general adult medical examination without abnormal findings: Secondary | ICD-10-CM

## 2022-04-10 LAB — PSA: PSA: 1.08 ng/mL (ref 0.10–4.00)

## 2022-04-10 LAB — HEMOGLOBIN A1C: Hgb A1c MFr Bld: 5.6 % (ref 4.6–6.5)

## 2022-04-10 LAB — BASIC METABOLIC PANEL
BUN: 14 mg/dL (ref 6–23)
CO2: 28 mEq/L (ref 19–32)
Calcium: 9.8 mg/dL (ref 8.4–10.5)
Chloride: 95 mEq/L — ABNORMAL LOW (ref 96–112)
Creatinine, Ser: 0.88 mg/dL (ref 0.40–1.50)
GFR: 98.7 mL/min (ref >60.00)
Glucose, Bld: 100 mg/dL — ABNORMAL HIGH (ref 70–99)
Potassium: 4.6 mEq/L (ref 3.5–5.1)
Sodium: 132 mEq/L — ABNORMAL LOW (ref 135–145)

## 2022-04-10 LAB — HEPATIC FUNCTION PANEL
ALT: 26 U/L (ref 0–53)
AST: 27 U/L (ref 0–37)
Albumin: 4.6 g/dL (ref 3.5–5.2)
Alkaline Phosphatase: 41 U/L (ref 39–117)
Bilirubin, Direct: 0.1 mg/dL (ref 0.0–0.3)
Total Bilirubin: 0.8 mg/dL (ref 0.2–1.2)
Total Protein: 7.3 g/dL (ref 6.0–8.3)

## 2022-04-10 LAB — CBC WITH DIFFERENTIAL/PLATELET
Basophils Absolute: 0 10*3/uL (ref 0.0–0.1)
Basophils Relative: 1 % (ref 0.0–3.0)
Eosinophils Absolute: 0.2 10*3/uL (ref 0.0–0.7)
Eosinophils Relative: 4.4 % (ref 0.0–5.0)
HCT: 46.7 % (ref 39.0–52.0)
Hemoglobin: 15.8 g/dL (ref 13.0–17.0)
Lymphocytes Relative: 34.8 % (ref 12.0–46.0)
Lymphs Abs: 1.4 10*3/uL (ref 0.7–4.0)
MCHC: 33.9 g/dL (ref 30.0–36.0)
MCV: 90.3 fl (ref 78.0–100.0)
Monocytes Absolute: 0.5 10*3/uL (ref 0.1–1.0)
Monocytes Relative: 11.1 % (ref 3.0–12.0)
Neutro Abs: 2 10*3/uL (ref 1.4–7.7)
Neutrophils Relative %: 48.7 % (ref 43.0–77.0)
Platelets: 219 10*3/uL (ref 150.0–400.0)
RBC: 5.17 Mil/uL (ref 4.22–5.81)
RDW: 13.6 % (ref 11.5–15.5)
WBC: 4.1 10*3/uL (ref 4.0–10.5)

## 2022-04-10 LAB — LIPID PANEL
Cholesterol: 274 mg/dL — ABNORMAL HIGH (ref 0–200)
HDL: 121.3 mg/dL (ref >39.00)
LDL Cholesterol: 138 mg/dL — ABNORMAL HIGH (ref 0–99)
NonHDL: 153.13
Total CHOL/HDL Ratio: 2
Triglycerides: 76 mg/dL (ref 0.0–149.0)
VLDL: 15.2 mg/dL (ref 0.0–40.0)

## 2022-04-10 LAB — TSH: TSH: 1.69 u[IU]/mL (ref 0.35–5.50)

## 2022-04-10 MED ORDER — OMEPRAZOLE 40 MG PO CPDR: 40.0000 mg | DELAYED_RELEASE_CAPSULE | Freq: Every day | ORAL | 3 refills | Status: DC

## 2022-04-10 NOTE — Progress Notes (Signed)
   Subjective:    Patient ID: Cody Wyatt, male    DOB: 1970/10/05, 51 y.o.   MRN: [id number]  HPI Here for a well exam. He feels great.    Review of Systems  Constitutional: Negative.   HENT: Negative.    Eyes: Negative.   Respiratory: Negative.    Cardiovascular: Negative.   Gastrointestinal: Negative.   Genitourinary: Negative.   Musculoskeletal: Negative.   Skin: Negative.   Neurological: Negative.   Psychiatric/Behavioral: Negative.         Objective:   Physical Exam Constitutional:      General: He is not in acute distress.    Appearance: Normal appearance. He is well-developed. He is not diaphoretic.  HENT:     Head: Normocephalic and atraumatic.     Right Ear: External ear normal.     Left Ear: External ear normal.     Nose: Nose normal.     Mouth/Throat:     Pharynx: No oropharyngeal exudate.  Eyes:     General: No scleral icterus.       Right eye: No discharge.        Left eye: No discharge.     Conjunctiva/sclera: Conjunctivae normal.     Pupils: Pupils are equal, round, and reactive to light.  Neck:     Thyroid: No thyromegaly.     Vascular: No JVD.     Trachea: No tracheal deviation.  Cardiovascular:     Rate and Rhythm: Normal rate and regular rhythm.     Heart sounds: Normal heart sounds. No murmur heard.    No friction rub. No gallop.  Pulmonary:     Effort: Pulmonary effort is normal. No respiratory distress.     Breath sounds: Normal breath sounds. No wheezing or rales.  Chest:     Chest wall: No tenderness.  Abdominal:     General: Bowel sounds are normal. There is no distension.     Palpations: Abdomen is soft. There is no mass.     Tenderness: There is no abdominal tenderness. There is no guarding or rebound.  Genitourinary:    Penis: Normal. No tenderness.      Testes: Normal.     Prostate: Normal.     Rectum: Normal. Guaiac result negative.  Musculoskeletal:        General: No tenderness. Normal range of motion.     Cervical  back: Neck supple.  Lymphadenopathy:     Cervical: No cervical adenopathy.  Skin:    General: Skin is warm and dry.     Coloration: Skin is not pale.     Findings: No erythema or rash.  Neurological:     Mental Status: He is alert and oriented to person, place, and time.     Cranial Nerves: No cranial nerve deficit.     Motor: No abnormal muscle tone.     Coordination: Coordination normal.     Deep Tendon Reflexes: Reflexes are normal and symmetric. Reflexes normal.  Psychiatric:        Behavior: Behavior normal.        Thought Content: Thought content normal.        Judgment: Judgment normal.           Assessment & Plan:  Well exam. We discussed diet and exercise. Get fasting labs. Alysia Penna, MD

## 2022-04-21 ENCOUNTER — Other Ambulatory Visit: Payer: Self-pay | Admitting: Family Medicine

## 2022-04-24 ENCOUNTER — Ambulatory Visit (HOSPITAL_BASED_OUTPATIENT_CLINIC_OR_DEPARTMENT_OTHER)
Admission: RE | Admit: 2022-04-24 | Discharge: 2022-04-24 | Disposition: A | Discharge status: Home / Self Care | Payer: Commercial Managed Care - PPO | Source: Ambulatory Visit | Attending: Family Medicine | Admitting: Family Medicine

## 2022-04-24 DIAGNOSIS — Z8249 Family history of ischemic heart disease and other diseases of the circulatory system: Secondary | ICD-10-CM | POA: Insufficient documentation

## 2022-04-24 NOTE — Telephone Encounter (Signed)
Pt LOV was on 04/10/22 Last refill was done on 11/09/20 Please Advise

## 2022-04-26 ENCOUNTER — Telehealth: Payer: Self-pay | Admitting: Family Medicine

## 2022-04-26 NOTE — Telephone Encounter (Signed)
Last refill-11/09/20--60 tab, 5 refills Last OV- 04/10/22

## 2022-04-26 NOTE — Telephone Encounter (Signed)
Requesting refill clonazePAM (KLONOPIN) 1 MG tablet  [id number] [address]:  [phone] Fax:  [phone]

## 2022-04-27 MED ORDER — CLONAZEPAM 1 MG PO TABS: 1.0000 mg | ORAL_TABLET | Freq: Two times a day (BID) | ORAL | 5 refills | Status: DC | PRN

## 2022-04-27 NOTE — Addendum Note (Signed)
Addended by: Alysia Penna A on: 04/27/2022 08:58 AM   Modules accepted: Orders

## 2022-04-27 NOTE — Telephone Encounter (Signed)
Done

## 2022-06-30 ENCOUNTER — Other Ambulatory Visit: Payer: Self-pay | Admitting: Family Medicine

## 2022-07-02 NOTE — Telephone Encounter (Addendum)
Last OV CPE-04/10/22 Last refill- 12/25/21--60 capsules, 5 refills  No future OV scheduled.

## 2022-08-06 ENCOUNTER — Other Ambulatory Visit: Payer: Self-pay | Admitting: Family Medicine

## 2022-08-14 ENCOUNTER — Ambulatory Visit: Payer: Commercial Managed Care - PPO | Admitting: Family Medicine

## 2022-08-16 ENCOUNTER — Ambulatory Visit: Payer: Commercial Managed Care - PPO | Admitting: Family Medicine

## 2022-08-16 ENCOUNTER — Encounter: Payer: Self-pay | Admitting: Family Medicine

## 2022-08-16 VITALS — BP 128/80 | HR 63 | Temp 97.8°F | Wt 190.0 lb

## 2022-08-16 DIAGNOSIS — R030 Elevated blood-pressure reading, without diagnosis of hypertension: Secondary | ICD-10-CM

## 2022-08-16 DIAGNOSIS — Z23 Encounter for immunization: Secondary | ICD-10-CM

## 2022-08-16 NOTE — Progress Notes (Signed)
   Subjective:    Patient ID: Cody Wyatt, male    DOB: 02/21/1971, 51 y.o.   MRN: [id number]  HPI Here with questions about his BP. His BP has always been good when he comes here, but he purchased a cuff to check this at home a few weeks ago, and he has been getting some high readings. He usually checks this in the mornings after he has had a few cups of coffee. These have been running 130-160/90-100 lately. He has felt fine.    Review of Systems  Constitutional: Negative.   Respiratory: Negative.    Cardiovascular: Negative.   Neurological: Negative.        Objective:   Physical Exam Constitutional:      Appearance: Normal appearance.  Cardiovascular:     Rate and Rhythm: Normal rate and regular rhythm.     Pulses: Normal pulses.     Heart sounds: Normal heart sounds.  Pulmonary:     Effort: Pulmonary effort is normal.     Breath sounds: Normal breath sounds.  Neurological:     Mental Status: He is alert.           Assessment & Plan:  His BP is normal here today. We discussed the proper way to take a BP, and I asked him to check it several times a day rather than just in the mornings. He knows to limit his sodium intake. I also asked him to sit quietly for 5  minutes before checking the BP if possible. He will check the BP 4 times a day for the next 2 weeks, and then he will report back to [place]. Alysia Penna, MD

## 2022-08-16 NOTE — Addendum Note (Signed)
Addended by: Wyvonne Lenz on: 08/16/2022 10:03 AM   Modules accepted: Orders

## 2022-10-01 ENCOUNTER — Encounter: Payer: Self-pay | Admitting: Family Medicine

## 2022-10-01 ENCOUNTER — Ambulatory Visit: Payer: Commercial Managed Care - PPO | Admitting: Family Medicine

## 2022-10-01 VITALS — BP 124/80 | HR 70 | Temp 98.5°F | Wt 185.0 lb

## 2022-10-01 DIAGNOSIS — J069 Acute upper respiratory infection, unspecified: Secondary | ICD-10-CM | POA: Diagnosis not present

## 2022-10-01 DIAGNOSIS — J029 Acute pharyngitis, unspecified: Secondary | ICD-10-CM | POA: Diagnosis not present

## 2022-10-01 DIAGNOSIS — R059 Cough, unspecified: Secondary | ICD-10-CM

## 2022-10-01 LAB — POC COVID19 BINAXNOW: SARS Coronavirus 2 Ag: NEGATIVE

## 2022-10-01 LAB — POCT RAPID STREP A (OFFICE): Rapid Strep A Screen: NEGATIVE

## 2022-10-01 NOTE — Progress Notes (Signed)
   Subjective:    Patient ID: Cody Wyatt, male    DOB: April 17, 1971, 52 y.o.   MRN: [id number]  HPI Here for 2 days of a scratchy throat and a dry cough. No fever or headache or body aches. Drinking fluids.    Review of Systems  Constitutional: Negative.   HENT:  Positive for congestion, postnasal drip and sore throat. Negative for ear pain and sinus pressure.   Eyes: Negative.   Respiratory:  Positive for cough. Negative for shortness of breath and wheezing.        Objective:   Physical Exam Constitutional:      Appearance: Normal appearance. He is not ill-appearing.  HENT:     Right Ear: Tympanic membrane, ear canal and external ear normal.     Left Ear: Tympanic membrane, ear canal and external ear normal.     Nose: Nose normal.     Mouth/Throat:     Pharynx: Oropharynx is clear.  Eyes:     Conjunctiva/sclera: Conjunctivae normal.  Pulmonary:     Effort: Pulmonary effort is normal.     Breath sounds: Normal breath sounds.  Lymphadenopathy:     Cervical: No cervical adenopathy.  Neurological:     Mental Status: He is alert.           Assessment & Plan:  Viral URI. Use Delsym as needed.  Alysia Penna, MD

## 2022-10-03 ENCOUNTER — Encounter: Payer: Self-pay | Admitting: Family Medicine

## 2022-10-04 MED ORDER — AZITHROMYCIN 250 MG PO TABS: ORAL_TABLET | ORAL | 0 refills | Status: AC

## 2022-10-04 NOTE — Telephone Encounter (Signed)
Call in a Zpack  ?

## 2023-01-13 ENCOUNTER — Other Ambulatory Visit: Payer: Self-pay | Admitting: Family Medicine

## 2023-01-15 NOTE — Telephone Encounter (Signed)
Pt LOV was on 10/01/2022 Last refill was done on 07/02/22 Pt is aware that DR Clent Ridges will be back on 01/21/23. Pt has enough until Dr Clent Ridges returns to the office on 01/21/23. Please advise

## 2023-01-22 LAB — LAB REPORT - SCANNED
A1c: 5.2
EGFR: 104

## 2023-02-15 ENCOUNTER — Other Ambulatory Visit: Payer: Self-pay | Admitting: Family Medicine

## 2023-07-30 ENCOUNTER — Encounter: Payer: Self-pay | Admitting: Family Medicine

## 2023-07-30 ENCOUNTER — Ambulatory Visit: Payer: Commercial Managed Care - PPO | Admitting: Family Medicine

## 2023-07-30 VITALS — BP 118/78 | HR 78 | Temp 98.0°F | Ht 68.25 in | Wt 183.4 lb

## 2023-07-30 DIAGNOSIS — Z0000 Encounter for general adult medical examination without abnormal findings: Secondary | ICD-10-CM | POA: Diagnosis not present

## 2023-07-30 DIAGNOSIS — Z23 Encounter for immunization: Secondary | ICD-10-CM | POA: Diagnosis not present

## 2023-07-30 DIAGNOSIS — Z125 Encounter for screening for malignant neoplasm of prostate: Secondary | ICD-10-CM

## 2023-07-30 LAB — CBC WITH DIFFERENTIAL/PLATELET
Basophils Absolute: 0.1 10*3/uL (ref 0.0–0.1)
Basophils Relative: 0.7 % (ref 0.0–3.0)
Eosinophils Absolute: 0.1 10*3/uL (ref 0.0–0.7)
Eosinophils Relative: 1.8 % (ref 0.0–5.0)
HCT: 45.7 % (ref 39.0–52.0)
Hemoglobin: 15.6 g/dL (ref 13.0–17.0)
Lymphocytes Relative: 22.3 % (ref 12.0–46.0)
Lymphs Abs: 1.6 10*3/uL (ref 0.7–4.0)
MCHC: 34.1 g/dL (ref 30.0–36.0)
MCV: 91.7 fL (ref 78.0–100.0)
Monocytes Absolute: 0.6 10*3/uL (ref 0.1–1.0)
Monocytes Relative: 7.9 % (ref 3.0–12.0)
Neutro Abs: 4.7 10*3/uL (ref 1.4–7.7)
Neutrophils Relative %: 67.3 % (ref 43.0–77.0)
Platelets: 254 10*3/uL (ref 150.0–400.0)
RBC: 4.98 Mil/uL (ref 4.22–5.81)
RDW: 13.2 % (ref 11.5–15.5)
WBC: 7.1 10*3/uL (ref 4.0–10.5)

## 2023-07-30 LAB — HEPATIC FUNCTION PANEL
ALT: 22 U/L (ref 0–53)
AST: 23 U/L (ref 0–37)
Albumin: 4.6 g/dL (ref 3.5–5.2)
Alkaline Phosphatase: 42 U/L (ref 39–117)
Bilirubin, Direct: 0.2 mg/dL (ref 0.0–0.3)
Total Bilirubin: 1 mg/dL (ref 0.2–1.2)
Total Protein: 7.3 g/dL (ref 6.0–8.3)

## 2023-07-30 LAB — BASIC METABOLIC PANEL
BUN: 12 mg/dL (ref 6–23)
CO2: 29 meq/L (ref 19–32)
Calcium: 9.6 mg/dL (ref 8.4–10.5)
Chloride: 95 meq/L — ABNORMAL LOW (ref 96–112)
Creatinine, Ser: 0.83 mg/dL (ref 0.40–1.50)
GFR: 99.54 mL/min (ref >60.00)
Glucose, Bld: 89 mg/dL (ref 70–99)
Potassium: 4.2 meq/L (ref 3.5–5.1)
Sodium: 133 meq/L — ABNORMAL LOW (ref 135–145)

## 2023-07-30 LAB — LIPID PANEL
Cholesterol: 235 mg/dL — ABNORMAL HIGH (ref 0–200)
HDL: 97.6 mg/dL (ref >39.00)
LDL Cholesterol: 127 mg/dL — ABNORMAL HIGH (ref 0–99)
NonHDL: 137.77
Total CHOL/HDL Ratio: 2
Triglycerides: 52 mg/dL (ref 0.0–149.0)
VLDL: 10.4 mg/dL (ref 0.0–40.0)

## 2023-07-30 LAB — TSH: TSH: 1.86 u[IU]/mL (ref 0.35–5.50)

## 2023-07-30 LAB — HEMOGLOBIN A1C: Hgb A1c MFr Bld: 5.3 % (ref 4.6–6.5)

## 2023-07-30 LAB — PSA: PSA: 1.13 ng/mL (ref 0.10–4.00)

## 2023-07-30 MED ORDER — PROPRANOLOL HCL 10 MG PO TABS: ORAL_TABLET | ORAL | 2 refills | Status: DC

## 2023-07-30 MED ORDER — OMEPRAZOLE 40 MG PO CPDR: 40.0000 mg | DELAYED_RELEASE_CAPSULE | Freq: Every day | ORAL | 3 refills | Status: DC

## 2023-07-30 MED ORDER — TEMAZEPAM 15 MG PO CAPS: 30.0000 mg | ORAL_CAPSULE | Freq: Every evening | ORAL | 5 refills | Status: DC | PRN

## 2023-07-30 NOTE — Addendum Note (Signed)
Addended by: Carola Rhine on: 07/30/2023 02:24 PM   Modules accepted: Orders

## 2023-07-30 NOTE — Progress Notes (Signed)
Subjective:    Patient ID: Cody Wyatt, male    DOB: 12-18-1970, 52 y.o.   MRN: [id number]  HPI Here for a well exam. He feels well. His only concern is the anxiety he feels when he speaks at work meetings. He has used Clonazepam for this in the past and it works well, however he always feels groggy afterwards.    Review of Systems  Constitutional: Negative.   HENT: Negative.    Eyes: Negative.   Respiratory: Negative.    Cardiovascular: Negative.   Gastrointestinal: Negative.   Genitourinary: Negative.   Musculoskeletal: Negative.   Skin: Negative.   Neurological: Negative.   Psychiatric/Behavioral: Negative.         Objective:   Physical Exam Constitutional:      General: He is not in acute distress.    Appearance: Normal appearance. He is well-developed. He is not diaphoretic.  HENT:     Head: Normocephalic and atraumatic.     Right Ear: External ear normal.     Left Ear: External ear normal.     Nose: Nose normal.     Mouth/Throat:     Pharynx: No oropharyngeal exudate.  Eyes:     General: No scleral icterus.       Right eye: No discharge.        Left eye: No discharge.     Conjunctiva/sclera: Conjunctivae normal.     Pupils: Pupils are equal, round, and reactive to light.  Neck:     Thyroid: No thyromegaly.     Vascular: No JVD.     Trachea: No tracheal deviation.  Cardiovascular:     Rate and Rhythm: Normal rate and regular rhythm.     Pulses: Normal pulses.     Heart sounds: Normal heart sounds. No murmur heard.    No friction rub. No gallop.  Pulmonary:     Effort: Pulmonary effort is normal. No respiratory distress.     Breath sounds: Normal breath sounds. No wheezing or rales.  Chest:     Chest wall: No tenderness.  Abdominal:     General: Bowel sounds are normal. There is no distension.     Palpations: Abdomen is soft. There is no mass.     Tenderness: There is no abdominal tenderness. There is no guarding or rebound.  Genitourinary:     Penis: Normal. No tenderness.      Testes: Normal.     Prostate: Normal.     Rectum: Normal. Guaiac result negative.  Musculoskeletal:        General: No tenderness. Normal range of motion.     Cervical back: Neck supple.  Lymphadenopathy:     Cervical: No cervical adenopathy.  Skin:    General: Skin is warm and dry.     Coloration: Skin is not pale.     Findings: No erythema or rash.  Neurological:     General: No focal deficit present.     Mental Status: He is alert and oriented to person, place, and time.     Cranial Nerves: No cranial nerve deficit.     Motor: No abnormal muscle tone.     Coordination: Coordination normal.     Deep Tendon Reflexes: Reflexes are normal and symmetric. Reflexes normal.  Psychiatric:        Mood and Affect: Mood normal.        Behavior: Behavior normal.        Thought Content: Thought content normal.  Judgment: Judgment normal.           Assessment & Plan:  Well exam. We discussed diet and exercise. Get fasting labs. He will stop Clonazepam and he will try Propranolol in speaking situations.  Gershon Crane, MD

## 2023-08-25 ENCOUNTER — Other Ambulatory Visit: Payer: Self-pay | Admitting: Family Medicine

## 2023-08-26 NOTE — Telephone Encounter (Signed)
 Pt LOV  was 07/30/23 Please advise

## 2023-08-27 ENCOUNTER — Other Ambulatory Visit: Payer: Self-pay | Admitting: Family Medicine

## 2023-08-27 NOTE — Telephone Encounter (Signed)
 Copied from CRM [id number]. Topic: Clinical - Medication Refill >> Aug 27, 2023  9:00 AM Kara C wrote: Most Recent Primary Care Visit:  Provider: JOHNNY SENIOR A  Department: [facility]  Visit Type: PHYSICAL  Date: 07/30/2023  Medication: temazepam  (RESTORIL ) 15 MG capsule   Has the patient contacted their pharmacy?  (Agent: If no, request that the patient contact the pharmacy for the refill. If patient does not wish to contact the pharmacy document the reason why and proceed with request.) (Agent: If yes, when and what did the pharmacy advise?)  Is this the correct pharmacy for this prescription? Yes If no, delete pharmacy and type the correct one.   This is the patient's preferred pharmacy:  [id number] [place], [place] - [address] [place] [address] Phone: [phone] Fax: [phone]   Has the prescription been filled recently? Yes  Is the patient out of the medication? No  Has the patient been seen for an appointment in the last year OR does the patient have an upcoming appointment?   Can we respond through MyChart?   Agent: Please be advised that Rx refills may take up to 3 business days. We ask that you follow-up with your pharmacy.

## 2023-10-21 ENCOUNTER — Other Ambulatory Visit: Payer: Self-pay | Admitting: Family Medicine

## 2023-10-23 ENCOUNTER — Ambulatory Visit: Admitting: Family Medicine

## 2024-03-02 ENCOUNTER — Other Ambulatory Visit: Payer: Self-pay | Admitting: Family Medicine

## 2024-03-04 ENCOUNTER — Other Ambulatory Visit: Payer: Self-pay | Admitting: Family Medicine

## 2024-03-04 MED ORDER — HYDROCORT-PRAMOXINE (PERIANAL) 2.5-1 % EX CREA: TOPICAL_CREAM | Freq: Three times a day (TID) | CUTANEOUS | 11 refills | Status: AC

## 2024-03-04 NOTE — Telephone Encounter (Signed)
 Copied from CRM [id number]. Topic: Clinical - Medication Refill >> Mar 04, 2024  3:40 PM Shardie S wrote: Medication: hydrocortisone -pramoxine (ANALPRAM -HC) 2.5-1 % rectal cream  Has the patient contacted their pharmacy? No (Agent: If no, request that the patient contact the pharmacy for the refill. If patient does not wish to contact the pharmacy document the reason why and proceed with request.) (Agent: If yes, when and what did the pharmacy advise?)  This is the patient's preferred pharmacy:  [id number] [place], [place] - [address] [place] [address] Phone: [phone] Fax: [phone]    Is this the correct pharmacy for this prescription? Yes If no, delete pharmacy and type the correct one.   Has the prescription been filled recently? No  Is the patient out of the medication? Yes  Has the patient been seen for an appointment in the last year OR does the patient have an upcoming appointment? Yes  Can we respond through MyChart? No  Agent: Please be advised that Rx refills may take up to 3 business days. We ask that you follow-up with your pharmacy.

## 2024-05-21 ENCOUNTER — Other Ambulatory Visit: Payer: Self-pay | Admitting: Family Medicine

## 2024-07-02 ENCOUNTER — Telehealth: Payer: Self-pay | Admitting: *Deleted

## 2024-07-02 DIAGNOSIS — N138 Other obstructive and reflux uropathy: Secondary | ICD-10-CM

## 2024-07-02 DIAGNOSIS — N529 Male erectile dysfunction, unspecified: Secondary | ICD-10-CM

## 2024-07-02 DIAGNOSIS — E785 Hyperlipidemia, unspecified: Secondary | ICD-10-CM

## 2024-07-02 DIAGNOSIS — R739 Hyperglycemia, unspecified: Secondary | ICD-10-CM

## 2024-07-02 NOTE — Telephone Encounter (Signed)
 Copied from CRM [id number]. Topic: Clinical - Request for Lab/Test Order >> Jul 02, 2024  3:47 PM Berneda FALCON wrote: Reason for CRM: Patient has an appt for physical scheduled 12/10 and would like orders for labs placed so he can do the labs prior to the physical, please.

## 2024-07-06 NOTE — Telephone Encounter (Signed)
 I ordered the labs.

## 2024-07-06 NOTE — Addendum Note (Signed)
 Addended by: JOHNNY SENIOR A on: 07/06/2024 01:03 PM   Modules accepted: Orders

## 2024-07-09 NOTE — Telephone Encounter (Signed)
 Pt has been scheduled for lab appointment on 07/28/24. Pt is requesting for Dr Johnny to add a Urine and Testosterone tests to the labs

## 2024-07-09 NOTE — Addendum Note (Signed)
 Addended by: JOHNNY SENIOR A on: 07/09/2024 12:46 PM   Modules accepted: Orders

## 2024-07-09 NOTE — Telephone Encounter (Signed)
 Done

## 2024-07-27 ENCOUNTER — Telehealth: Payer: Self-pay | Admitting: Family Medicine

## 2024-07-28 ENCOUNTER — Other Ambulatory Visit

## 2024-07-28 DIAGNOSIS — N401 Enlarged prostate with lower urinary tract symptoms: Secondary | ICD-10-CM

## 2024-07-28 DIAGNOSIS — N529 Male erectile dysfunction, unspecified: Secondary | ICD-10-CM

## 2024-07-28 DIAGNOSIS — E785 Hyperlipidemia, unspecified: Secondary | ICD-10-CM

## 2024-07-28 DIAGNOSIS — N138 Other obstructive and reflux uropathy: Secondary | ICD-10-CM

## 2024-07-28 DIAGNOSIS — R739 Hyperglycemia, unspecified: Secondary | ICD-10-CM

## 2024-07-28 LAB — LIPID PANEL
Cholesterol: 247 mg/dL — ABNORMAL HIGH (ref 0–200)
HDL: 108.3 mg/dL (ref >39.00)
LDL Cholesterol: 128 mg/dL — ABNORMAL HIGH (ref 0–99)
NonHDL: 139.08
Total CHOL/HDL Ratio: 2
Triglycerides: 57 mg/dL (ref 0.0–149.0)
VLDL: 11.4 mg/dL (ref 0.0–40.0)

## 2024-07-28 LAB — URINALYSIS
Bilirubin Urine: NEGATIVE
Hgb urine dipstick: NEGATIVE
Ketones, ur: NEGATIVE
Leukocytes,Ua: NEGATIVE
Nitrite: NEGATIVE
Specific Gravity, Urine: <=1.005 — AB (ref 1.000–1.030)
Total Protein, Urine: NEGATIVE
Urine Glucose: NEGATIVE
Urobilinogen, UA: 0.2 (ref 0.0–1.0)
pH: 7 (ref 5.0–8.0)

## 2024-07-28 LAB — CBC WITH DIFFERENTIAL/PLATELET
Basophils Absolute: 0.1 K/uL (ref 0.0–0.1)
Basophils Relative: 1.1 % (ref 0.0–3.0)
Eosinophils Absolute: 0.1 K/uL (ref 0.0–0.7)
Eosinophils Relative: 2.5 % (ref 0.0–5.0)
HCT: 45.8 % (ref 39.0–52.0)
Hemoglobin: 15.5 g/dL (ref 13.0–17.0)
Lymphocytes Relative: 31.9 % (ref 12.0–46.0)
Lymphs Abs: 1.5 K/uL (ref 0.7–4.0)
MCHC: 33.9 g/dL (ref 30.0–36.0)
MCV: 92 fl (ref 78.0–100.0)
Monocytes Absolute: 0.5 K/uL (ref 0.1–1.0)
Monocytes Relative: 11.2 % (ref 3.0–12.0)
Neutro Abs: 2.5 K/uL (ref 1.4–7.7)
Neutrophils Relative %: 53.3 % (ref 43.0–77.0)
Platelets: 230 K/uL (ref 150.0–400.0)
RBC: 4.98 Mil/uL (ref 4.22–5.81)
RDW: 13.5 % (ref 11.5–15.5)
WBC: 4.7 K/uL (ref 4.0–10.5)

## 2024-07-28 LAB — HEMOGLOBIN A1C: Hgb A1c MFr Bld: 5 % (ref 4.6–6.5)

## 2024-07-28 LAB — HEPATIC FUNCTION PANEL
ALT: 22 U/L (ref 0–53)
AST: 24 U/L (ref 0–37)
Albumin: 4.8 g/dL (ref 3.5–5.2)
Alkaline Phosphatase: 43 U/L (ref 39–117)
Bilirubin, Direct: 0.1 mg/dL (ref 0.0–0.3)
Total Bilirubin: 0.7 mg/dL (ref 0.2–1.2)
Total Protein: 7.4 g/dL (ref 6.0–8.3)

## 2024-07-28 LAB — BASIC METABOLIC PANEL WITH GFR
BUN: 13 mg/dL (ref 6–23)
CO2: 28 meq/L (ref 19–32)
Calcium: 9.6 mg/dL (ref 8.4–10.5)
Chloride: 91 meq/L — ABNORMAL LOW (ref 96–112)
Creatinine, Ser: 0.85 mg/dL (ref 0.40–1.50)
GFR: 98.14 mL/min (ref >60.00)
Glucose, Bld: 94 mg/dL (ref 70–99)
Potassium: 4.1 meq/L (ref 3.5–5.1)
Sodium: 129 meq/L — ABNORMAL LOW (ref 135–145)

## 2024-07-28 LAB — TESTOSTERONE: Testosterone: 605.04 ng/dL (ref 300.00–890.00)

## 2024-07-28 LAB — TSH: TSH: 2.02 u[IU]/mL (ref 0.35–5.50)

## 2024-07-28 LAB — PSA: PSA: 0.96 ng/mL (ref 0.10–4.00)

## 2024-07-29 ENCOUNTER — Ambulatory Visit: Payer: Self-pay | Admitting: Family Medicine

## 2024-07-30 ENCOUNTER — Encounter: Payer: Self-pay | Admitting: Family Medicine

## 2024-07-30 ENCOUNTER — Ambulatory Visit: Admitting: Family Medicine

## 2024-07-30 VITALS — BP 116/72 | HR 65 | Temp 97.7°F | Ht 68.0 in | Wt 188.2 lb

## 2024-07-30 DIAGNOSIS — Z23 Encounter for immunization: Secondary | ICD-10-CM | POA: Diagnosis not present

## 2024-07-30 DIAGNOSIS — Z0000 Encounter for general adult medical examination without abnormal findings: Secondary | ICD-10-CM

## 2024-07-30 MED ORDER — TEMAZEPAM 15 MG PO CAPS: 30.0000 mg | ORAL_CAPSULE | Freq: Every evening | ORAL | 5 refills | Status: AC | PRN

## 2024-07-30 NOTE — Progress Notes (Signed)
 Subjective:    Patient ID: Cody Wyatt, male    DOB: 1970/10/18, 53 y.o.   MRN: [id number]  HPI Here for a well exam. He feels fine.    Review of Systems  Constitutional: Negative.   HENT: Negative.    Eyes: Negative.   Respiratory: Negative.    Cardiovascular: Negative.   Gastrointestinal: Negative.   Genitourinary: Negative.   Musculoskeletal: Negative.   Skin: Negative.   Neurological: Negative.   Psychiatric/Behavioral: Negative.         Objective:   Physical Exam Constitutional:      General: He is not in acute distress.    Appearance: Normal appearance. He is well-developed. He is not diaphoretic.  HENT:     Head: Normocephalic and atraumatic.     Right Ear: External ear normal.     Left Ear: External ear normal.     Nose: Nose normal.     Mouth/Throat:     Pharynx: No oropharyngeal exudate.  Eyes:     General: No scleral icterus.       Right eye: No discharge.        Left eye: No discharge.     Conjunctiva/sclera: Conjunctivae normal.     Pupils: Pupils are equal, round, and reactive to light.  Neck:     Thyroid : No thyromegaly.     Vascular: No JVD.     Trachea: No tracheal deviation.  Cardiovascular:     Rate and Rhythm: Normal rate and regular rhythm.     Pulses: Normal pulses.     Heart sounds: Normal heart sounds. No murmur heard.    No friction rub. No gallop.  Pulmonary:     Effort: Pulmonary effort is normal. No respiratory distress.     Breath sounds: Normal breath sounds. No wheezing or rales.  Chest:     Chest wall: No tenderness.  Abdominal:     General: Bowel sounds are normal. There is no distension.     Palpations: Abdomen is soft. There is no mass.     Tenderness: There is no abdominal tenderness. There is no guarding or rebound.  Genitourinary:    Penis: Normal. No tenderness.      Testes: Normal.     Prostate: Normal.     Rectum: Normal. Guaiac result negative.  Musculoskeletal:        General: No tenderness. Normal  range of motion.     Cervical back: Neck supple.  Lymphadenopathy:     Cervical: No cervical adenopathy.  Skin:    General: Skin is warm and dry.     Coloration: Skin is not pale.     Findings: No erythema or rash.  Neurological:     General: No focal deficit present.     Mental Status: He is alert and oriented to person, place, and time.     Cranial Nerves: No cranial nerve deficit.     Motor: No abnormal muscle tone.     Coordination: Coordination normal.     Deep Tendon Reflexes: Reflexes are normal and symmetric. Reflexes normal.  Psychiatric:        Mood and Affect: Mood normal.        Behavior: Behavior normal.        Thought Content: Thought content normal.        Judgment: Judgment normal.           Assessment & Plan:  Well exam. We discussed diet and exercise. Get fasting labs. Garnette Olmsted,  MD
